# Patient Record
Sex: Female | Born: 1935 | Race: White | Hispanic: No | Smoking: Never smoker
Health system: Southern US, Community
[De-identification: ages and names within clinical notes are randomized; demographics above are authoritative.]

## PROBLEM LIST (undated history)

## (undated) DIAGNOSIS — G459 Transient cerebral ischemic attack, unspecified: Secondary | ICD-10-CM

## (undated) DIAGNOSIS — Z86718 Personal history of other venous thrombosis and embolism: Secondary | ICD-10-CM

## (undated) DIAGNOSIS — M5410 Radiculopathy, site unspecified: Secondary | ICD-10-CM

## (undated) DIAGNOSIS — G3184 Mild cognitive impairment, so stated: Secondary | ICD-10-CM

## (undated) DIAGNOSIS — I10 Essential (primary) hypertension: Secondary | ICD-10-CM

## (undated) HISTORY — PX: TONSILLECTOMY: SUR1361

## (undated) HISTORY — PX: TUBAL LIGATION: SHX77

---

## 2006-08-13 HISTORY — PX: APPENDECTOMY: SHX54

## 2019-05-21 ENCOUNTER — Emergency Department (HOSPITAL_COMMUNITY): Payer: Medicare Other

## 2019-05-21 ENCOUNTER — Other Ambulatory Visit: Payer: Self-pay

## 2019-05-21 ENCOUNTER — Encounter (HOSPITAL_COMMUNITY): Payer: Self-pay

## 2019-05-21 ENCOUNTER — Inpatient Hospital Stay (HOSPITAL_COMMUNITY)
Admission: EM | Admit: 2019-05-21 | Discharge: 2019-05-23 | DRG: 066 | Disposition: A | Discharge status: Home / Self Care | Payer: Medicare Other | Attending: Internal Medicine | Admitting: Internal Medicine

## 2019-05-21 DIAGNOSIS — Z880 Allergy status to penicillin: Secondary | ICD-10-CM

## 2019-05-21 DIAGNOSIS — R471 Dysarthria and anarthria: Secondary | ICD-10-CM | POA: Diagnosis present

## 2019-05-21 DIAGNOSIS — I10 Essential (primary) hypertension: Secondary | ICD-10-CM | POA: Diagnosis present

## 2019-05-21 DIAGNOSIS — R270 Ataxia, unspecified: Secondary | ICD-10-CM | POA: Diagnosis present

## 2019-05-21 DIAGNOSIS — E785 Hyperlipidemia, unspecified: Secondary | ICD-10-CM | POA: Diagnosis present

## 2019-05-21 DIAGNOSIS — I639 Cerebral infarction, unspecified: Secondary | ICD-10-CM

## 2019-05-21 DIAGNOSIS — R413 Other amnesia: Secondary | ICD-10-CM | POA: Diagnosis present

## 2019-05-21 DIAGNOSIS — I672 Cerebral atherosclerosis: Secondary | ICD-10-CM | POA: Diagnosis present

## 2019-05-21 DIAGNOSIS — R4781 Slurred speech: Secondary | ICD-10-CM | POA: Diagnosis not present

## 2019-05-21 DIAGNOSIS — I6381 Other cerebral infarction due to occlusion or stenosis of small artery: Principal | ICD-10-CM | POA: Diagnosis present

## 2019-05-21 DIAGNOSIS — R001 Bradycardia, unspecified: Secondary | ICD-10-CM | POA: Diagnosis present

## 2019-05-21 DIAGNOSIS — R7309 Other abnormal glucose: Secondary | ICD-10-CM | POA: Diagnosis present

## 2019-05-21 DIAGNOSIS — R030 Elevated blood-pressure reading, without diagnosis of hypertension: Secondary | ICD-10-CM

## 2019-05-21 DIAGNOSIS — Z20828 Contact with and (suspected) exposure to other viral communicable diseases: Secondary | ICD-10-CM | POA: Diagnosis present

## 2019-05-21 DIAGNOSIS — Z7982 Long term (current) use of aspirin: Secondary | ICD-10-CM

## 2019-05-21 DIAGNOSIS — Z8673 Personal history of transient ischemic attack (TIA), and cerebral infarction without residual deficits: Secondary | ICD-10-CM

## 2019-05-21 DIAGNOSIS — I6350 Cerebral infarction due to unspecified occlusion or stenosis of unspecified cerebral artery: Secondary | ICD-10-CM

## 2019-05-21 DIAGNOSIS — Z881 Allergy status to other antibiotic agents status: Secondary | ICD-10-CM

## 2019-05-21 DIAGNOSIS — Z79899 Other long term (current) drug therapy: Secondary | ICD-10-CM

## 2019-05-21 DIAGNOSIS — Z86718 Personal history of other venous thrombosis and embolism: Secondary | ICD-10-CM

## 2019-05-21 DIAGNOSIS — Z882 Allergy status to sulfonamides status: Secondary | ICD-10-CM

## 2019-05-21 HISTORY — DX: Personal history of other venous thrombosis and embolism: Z86.718

## 2019-05-21 HISTORY — DX: Mild cognitive impairment of uncertain or unknown etiology: G31.84

## 2019-05-21 HISTORY — DX: Radiculopathy, site unspecified: M54.10

## 2019-05-21 HISTORY — DX: Transient cerebral ischemic attack, unspecified: G45.9

## 2019-05-21 HISTORY — DX: Essential (primary) hypertension: I10

## 2019-05-21 LAB — CBC
HCT: 45.6 % (ref 36.0–46.0)
Hemoglobin: 15.4 g/dL — ABNORMAL HIGH (ref 12.0–15.0)
MCH: 31.4 pg (ref 26.0–34.0)
MCHC: 33.8 g/dL (ref 30.0–36.0)
MCV: 92.9 fL (ref 80.0–100.0)
Platelets: 204 10*3/uL (ref 150–400)
RBC: 4.91 MIL/uL (ref 3.87–5.11)
RDW: 13.4 % (ref 11.5–15.5)
WBC: 5.8 10*3/uL (ref 4.0–10.5)
nRBC: 0 % (ref 0.0–0.2)

## 2019-05-21 LAB — APTT: aPTT: 33 seconds (ref 24–36)

## 2019-05-21 LAB — DIFFERENTIAL
Abs Immature Granulocytes: 0.02 10*3/uL (ref 0.00–0.07)
Basophils Absolute: 0 10*3/uL (ref 0.0–0.1)
Basophils Relative: 1 %
Eosinophils Absolute: 0.1 10*3/uL (ref 0.0–0.5)
Eosinophils Relative: 1 %
Immature Granulocytes: 0 %
Lymphocytes Relative: 26 %
Lymphs Abs: 1.5 10*3/uL (ref 0.7–4.0)
Monocytes Absolute: 0.4 10*3/uL (ref 0.1–1.0)
Monocytes Relative: 7 %
Neutro Abs: 3.7 10*3/uL (ref 1.7–7.7)
Neutrophils Relative %: 65 %

## 2019-05-21 LAB — I-STAT CHEM 8, ED
BUN: 15 mg/dL (ref 8–23)
Calcium, Ion: 1.13 mmol/L — ABNORMAL LOW (ref 1.15–1.40)
Chloride: 103 mmol/L (ref 98–111)
Creatinine, Ser: 0.8 mg/dL (ref 0.44–1.00)
Glucose, Bld: 152 mg/dL — ABNORMAL HIGH (ref 70–99)
HCT: 45 % (ref 36.0–46.0)
Hemoglobin: 15.3 g/dL — ABNORMAL HIGH (ref 12.0–15.0)
Potassium: 4 mmol/L (ref 3.5–5.1)
Sodium: 139 mmol/L (ref 135–145)
TCO2: 25 mmol/L (ref 22–32)

## 2019-05-21 LAB — COMPREHENSIVE METABOLIC PANEL
ALT: 16 U/L (ref 0–44)
AST: 23 U/L (ref 15–41)
Albumin: 3.6 g/dL (ref 3.5–5.0)
Alkaline Phosphatase: 54 U/L (ref 38–126)
Anion gap: 9 (ref 5–15)
BUN: 14 mg/dL (ref 8–23)
CO2: 25 mmol/L (ref 22–32)
Calcium: 9.1 mg/dL (ref 8.9–10.3)
Chloride: 102 mmol/L (ref 98–111)
Creatinine, Ser: 1.02 mg/dL — ABNORMAL HIGH (ref 0.44–1.00)
GFR calc Af Amer: 56 mL/min — ABNORMAL LOW (ref >60)
GFR calc non Af Amer: 48 mL/min — ABNORMAL LOW (ref >60)
Glucose, Bld: 162 mg/dL — ABNORMAL HIGH (ref 70–99)
Potassium: 4 mmol/L (ref 3.5–5.1)
Sodium: 136 mmol/L (ref 135–145)
Total Bilirubin: 0.8 mg/dL (ref 0.3–1.2)
Total Protein: 6.1 g/dL — ABNORMAL LOW (ref 6.5–8.1)

## 2019-05-21 LAB — PROTIME-INR
INR: 1 (ref 0.8–1.2)
Prothrombin Time: 13.1 seconds (ref 11.4–15.2)

## 2019-05-21 MED ORDER — SODIUM CHLORIDE 0.9% FLUSH: 3.0000 mL | Freq: Once | INTRAVENOUS | Status: DC

## 2019-05-21 NOTE — ED Triage Notes (Signed)
Pt arrive POV for eval of dizziness, ataxia, slurred speech (w/ heavy tongue) onset Tuesday AM @ 0900. Daughter reports that she went home and slept for over 24 hours. States that she has regained ability to walk over last 24 hours. Reports ongoing aphasia, and speech difficulties. Daughter reports fall 2 weeks prior, on ASA. Daughter reports normally alert and healthy, walks 2 miles/day. Daughter reports ongoing fatigue, aphasia and confusion

## 2019-05-21 NOTE — ED Provider Notes (Signed)
[facility] Provider Note   CSN: [id number] Arrival date & time: 05/21/19  1412    History   Chief Complaint Chief Complaint  Patient presents with  . Dizziness  . Gait Problem    HPI Vicki Cummings is a 85 y.o. female.   The history is provided by a relative and the patient.  She has history of transient ischemic attack and comes in because of dizziness and speech problem.  She started having difficulty 2 days ago when she was very dizzy and completely unable to stand.  Speech was also markedly slurred.  She then slept for over 24 hours.  Since then, balance has improved but it is nowhere near her baseline.  Speech has improved but is still slightly slurred.  She denies headache, nausea, vomiting.  She did have an episode of dizziness about 30 years ago which was diagnosed as Mnire's disease.  Past Medical History:  Diagnosis Date  . H/O blood clots    "30-40 years ago"  . TIA (transient ischemic attack)     There are no active problems to display for this patient.   History reviewed. No pertinent surgical history.   OB History   No obstetric history on file.      Home Medications    Prior to Admission medications   Not on File    Family History History reviewed. No pertinent family history.  Social History Social History   Tobacco Use  . Smoking status: Never Smoker  . Smokeless tobacco: Never Used  Substance Use Topics  . Alcohol use: Never    Frequency: Never  . Drug use: Never     Allergies   Patient has no known allergies.   Review of Systems Review of Systems  Unable to perform ROS: Patient nonverbal  All other systems reviewed and are negative.    Physical Exam Updated Vital Signs BP (!) 169/71   Pulse (!) 54   Temp 98.5 F (36.9 C) (Oral)   Resp 16   Ht 5\' 1"  (1.549 m)   Wt 54.4 kg   SpO2 99%   BMI 22.67 kg/m   Physical Exam Vitals signs and nursing note reviewed.    85 year old  female, resting comfortably and in no acute distress. Vital signs are significant for elevated blood pressure and slightly slow heart rate. Oxygen saturation is 99%, which is normal. Head is normocephalic and atraumatic. PERRLA, EOMI. Oropharynx is clear. Neck is nontender and supple without adenopathy or JVD.  There are no carotid bruits. Back is nontender and there is no CVA tenderness. Lungs are clear without rales, wheezes, or rhonchi. Chest is nontender. Heart has regular rate and rhythm without murmur. Abdomen is soft, flat, nontender without masses or hepatosplenomegaly and peristalsis is normoactive. Extremities have no cyanosis or edema, full range of motion is present. Skin is warm and dry without rash. Neurologic: Awake and alert and oriented x3, speech is minimally dysarthric, cranial nerves are intact, there are no motor or sensory deficits.  Visual fields are intact to confrontation.  There is no pronator drift.  Finger-to-nose testing is normal.  Patient noted to have marked difficulty standing from transit chair to get into the stretcher.  ED Treatments / Results  Labs (all labs ordered are listed, but only abnormal results are displayed) Labs Reviewed  CBC - Abnormal; Notable for the following components:      Result Value   Hemoglobin 15.4 (*)    All  other components within normal limits  COMPREHENSIVE METABOLIC PANEL - Abnormal; Notable for the following components:   Glucose, Bld 162 (*)    Creatinine, Ser 1.02 (*)    Total Protein 6.1 (*)    GFR calc non Af Amer 48 (*)    GFR calc Af Amer 56 (*)    All other components within normal limits  I-STAT CHEM 8, ED - Abnormal; Notable for the following components:   Glucose, Bld 152 (*)    Calcium, Ion 1.13 (*)    Hemoglobin 15.3 (*)    All other components within normal limits  PROTIME-INR  APTT  DIFFERENTIAL  CBG MONITORING, ED    EKG EKG Interpretation  Date/Time:  Friday May 22 2019 01:52:25 EDT  Ventricular Rate:  47 PR Interval:    QRS Duration: 92 QT Interval:  470 QTC Calculation: 416 R Axis:   62 Text Interpretation:  Sinus bradycardia Minimal ST depression, inferior leads Minimal ST elevation, anterior leads No old tracing to compare Confirmed by Delora Fuel ([id number]) on 05/22/2019 2:02:27 AM   Radiology Ct Head Wo Contrast  Result Date: 05/21/2019 CLINICAL DATA:  Ataxia and slurred speech EXAM: CT HEAD WITHOUT CONTRAST TECHNIQUE: Contiguous axial images were obtained from the base of the skull through the vertex without intravenous contrast. COMPARISON:  None. FINDINGS: Brain: There is no mass, hemorrhage or extra-axial collection. There is generalized atrophy without lobar predilection. Hypodensity of the white matter is most commonly associated with chronic microvascular disease. Vascular: No abnormal hyperdensity of the major intracranial arteries or dural venous sinuses. No intracranial atherosclerosis. Skull: The visualized skull base, calvarium and extracranial soft tissues are normal. Sinuses/Orbits: Secretions in the sphenoid sinus. The orbits are normal. IMPRESSION: Chronic microvascular ischemia and generalized atrophy without acute intracranial abnormality. Electronically Signed   By: Ulyses Jarred M.D.   On: 05/21/2019 20:26   Mr Brain Wo Contrast  Result Date: 05/22/2019 CLINICAL DATA:  Slurred speech EXAM: MRI HEAD WITHOUT CONTRAST TECHNIQUE: Multiplanar, multiecho pulse sequences of the brain and surrounding structures were obtained without intravenous contrast. COMPARISON:  Head CT 05/21/2019 FINDINGS: BRAIN: There is an acute infarct of the left paramedian pons. No other acute ischemia. Multifocal white matter hyperintensity, most commonly due to chronic ischemic microangiopathy. There is generalized atrophy without lobar predilection. The midline structures are normal. VASCULAR: The major intracranial arterial and venous sinus flow voids are normal.  Susceptibility-sensitive sequences show no chronic microhemorrhage or superficial siderosis. SKULL AND UPPER CERVICAL SPINE: Calvarial bone marrow signal is normal. There is no skull base mass. The visualized upper cervical spine and soft tissues are normal. SINUSES/ORBITS: There are no fluid levels or advanced mucosal thickening. The mastoid air cells and middle ear cavities are free of fluid. The orbits are normal. IMPRESSION: 1. 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 2. No hemorrhage or mass effect. 3. Moderate chronic small vessel disease. Electronically Signed   By: Ulyses Jarred M.D.   On: 05/22/2019 01:04    Procedures Procedures   Medications Ordered in ED Medications  sodium chloride flush (NS) 0.9 % injection 3 mL (has no administration in time range)     Initial Impression / Assessment and Plan / ED Course  I have reviewed the triage vital signs and the nursing notes.  Pertinent labs & imaging results that were available during my care of the patient were reviewed by me and considered in my medical decision making (see chart for details).  Dizziness with ataxia and dysarthric  speech concerning for stroke.  Symptoms have improved dramatically but are still present.  Symptoms have been present for several days, so she is outside the window for thrombolytic therapy or endovascular therapy and outside of the window to activate code stroke.  ED work-up thus far has been unremarkable including normal CBC, metabolic panel significant for mildly elevated glucose.  CT head showed microvascular changes, no acute process.  She will be sent for MRI of the brain.  MRI does show left pontine stroke.  ECG shows minor nonspecific ST-T wave changes.  Case is discussed with Dr. Toniann Fail of [facility] hospitalist, who agrees to admit the patient.  Case also discussed with Dr. Otelia Limes of neurology service who will see the patient in consultation.  Final Clinical Impressions(s) / ED  Diagnoses   Final diagnoses:  Left pontine stroke (HCC)  Elevated blood pressure reading without diagnosis of hypertension    ED Discharge Orders    None       Dione Booze, MD 05/22/19 [phone]

## 2019-05-22 ENCOUNTER — Observation Stay (HOSPITAL_COMMUNITY): Payer: Medicare Other

## 2019-05-22 ENCOUNTER — Emergency Department (HOSPITAL_COMMUNITY): Payer: Medicare Other

## 2019-05-22 ENCOUNTER — Inpatient Hospital Stay (HOSPITAL_COMMUNITY): Payer: Medicare Other

## 2019-05-22 ENCOUNTER — Encounter (HOSPITAL_COMMUNITY): Payer: Self-pay | Admitting: Internal Medicine

## 2019-05-22 DIAGNOSIS — I672 Cerebral atherosclerosis: Secondary | ICD-10-CM | POA: Diagnosis present

## 2019-05-22 DIAGNOSIS — Z8673 Personal history of transient ischemic attack (TIA), and cerebral infarction without residual deficits: Secondary | ICD-10-CM | POA: Diagnosis not present

## 2019-05-22 DIAGNOSIS — I6389 Other cerebral infarction: Secondary | ICD-10-CM | POA: Diagnosis not present

## 2019-05-22 DIAGNOSIS — I6350 Cerebral infarction due to unspecified occlusion or stenosis of unspecified cerebral artery: Secondary | ICD-10-CM | POA: Diagnosis not present

## 2019-05-22 DIAGNOSIS — G3184 Mild cognitive impairment, so stated: Secondary | ICD-10-CM | POA: Insufficient documentation

## 2019-05-22 DIAGNOSIS — M5410 Radiculopathy, site unspecified: Secondary | ICD-10-CM | POA: Insufficient documentation

## 2019-05-22 DIAGNOSIS — I10 Essential (primary) hypertension: Secondary | ICD-10-CM | POA: Diagnosis present

## 2019-05-22 DIAGNOSIS — R413 Other amnesia: Secondary | ICD-10-CM | POA: Diagnosis present

## 2019-05-22 DIAGNOSIS — Z881 Allergy status to other antibiotic agents status: Secondary | ICD-10-CM | POA: Diagnosis not present

## 2019-05-22 DIAGNOSIS — R7309 Other abnormal glucose: Secondary | ICD-10-CM | POA: Diagnosis present

## 2019-05-22 DIAGNOSIS — R001 Bradycardia, unspecified: Secondary | ICD-10-CM | POA: Diagnosis present

## 2019-05-22 DIAGNOSIS — Z79899 Other long term (current) drug therapy: Secondary | ICD-10-CM | POA: Diagnosis not present

## 2019-05-22 DIAGNOSIS — I639 Cerebral infarction, unspecified: Secondary | ICD-10-CM

## 2019-05-22 DIAGNOSIS — Z880 Allergy status to penicillin: Secondary | ICD-10-CM | POA: Diagnosis not present

## 2019-05-22 DIAGNOSIS — R471 Dysarthria and anarthria: Secondary | ICD-10-CM | POA: Diagnosis present

## 2019-05-22 DIAGNOSIS — Z882 Allergy status to sulfonamides status: Secondary | ICD-10-CM | POA: Diagnosis not present

## 2019-05-22 DIAGNOSIS — Z86718 Personal history of other venous thrombosis and embolism: Secondary | ICD-10-CM | POA: Diagnosis not present

## 2019-05-22 DIAGNOSIS — I6381 Other cerebral infarction due to occlusion or stenosis of small artery: Secondary | ICD-10-CM | POA: Diagnosis present

## 2019-05-22 DIAGNOSIS — R4781 Slurred speech: Secondary | ICD-10-CM | POA: Diagnosis present

## 2019-05-22 DIAGNOSIS — R270 Ataxia, unspecified: Secondary | ICD-10-CM | POA: Diagnosis present

## 2019-05-22 DIAGNOSIS — Z20828 Contact with and (suspected) exposure to other viral communicable diseases: Secondary | ICD-10-CM | POA: Diagnosis present

## 2019-05-22 DIAGNOSIS — Z7982 Long term (current) use of aspirin: Secondary | ICD-10-CM | POA: Diagnosis not present

## 2019-05-22 DIAGNOSIS — E785 Hyperlipidemia, unspecified: Secondary | ICD-10-CM | POA: Diagnosis present

## 2019-05-22 LAB — HEMOGLOBIN A1C
Hgb A1c MFr Bld: 5.6 % (ref 4.8–5.6)
Mean Plasma Glucose: 114.02 mg/dL

## 2019-05-22 LAB — CBC
HCT: 45.1 % (ref 36.0–46.0)
Hemoglobin: 15.5 g/dL — ABNORMAL HIGH (ref 12.0–15.0)
MCH: 31.5 pg (ref 26.0–34.0)
MCHC: 34.4 g/dL (ref 30.0–36.0)
MCV: 91.7 fL (ref 80.0–100.0)
Platelets: 198 10*3/uL (ref 150–400)
RBC: 4.92 MIL/uL (ref 3.87–5.11)
RDW: 13.2 % (ref 11.5–15.5)
WBC: 5.6 10*3/uL (ref 4.0–10.5)
nRBC: 0 % (ref 0.0–0.2)

## 2019-05-22 LAB — COMPREHENSIVE METABOLIC PANEL
ALT: 16 U/L (ref 0–44)
AST: 23 U/L (ref 15–41)
Albumin: 3.6 g/dL (ref 3.5–5.0)
Alkaline Phosphatase: 50 U/L (ref 38–126)
Anion gap: 10 (ref 5–15)
BUN: 11 mg/dL (ref 8–23)
CO2: 27 mmol/L (ref 22–32)
Calcium: 9 mg/dL (ref 8.9–10.3)
Chloride: 103 mmol/L (ref 98–111)
Creatinine, Ser: 0.82 mg/dL (ref 0.44–1.00)
GFR calc Af Amer: >60 mL/min (ref >60)
GFR calc non Af Amer: >60 mL/min (ref >60)
Glucose, Bld: 106 mg/dL — ABNORMAL HIGH (ref 70–99)
Potassium: 3.9 mmol/L (ref 3.5–5.1)
Sodium: 140 mmol/L (ref 135–145)
Total Bilirubin: 1.1 mg/dL (ref 0.3–1.2)
Total Protein: 6 g/dL — ABNORMAL LOW (ref 6.5–8.1)

## 2019-05-22 LAB — LIPID PANEL
Cholesterol: 235 mg/dL — ABNORMAL HIGH (ref 0–200)
HDL: 78 mg/dL (ref >40)
LDL Cholesterol: 151 mg/dL — ABNORMAL HIGH (ref 0–99)
Total CHOL/HDL Ratio: 3 RATIO
Triglycerides: 32 mg/dL (ref <150)
VLDL: 6 mg/dL (ref 0–40)

## 2019-05-22 LAB — TROPONIN I (HIGH SENSITIVITY): Troponin I (High Sensitivity): 8 ng/L (ref <18)

## 2019-05-22 LAB — SARS CORONAVIRUS 2 (TAT 6-24 HRS): SARS Coronavirus 2: NEGATIVE

## 2019-05-22 LAB — ECHOCARDIOGRAM COMPLETE
Height: 61 in
Weight: 1920 oz

## 2019-05-22 LAB — TSH: TSH: 1.587 u[IU]/mL (ref 0.350–4.500)

## 2019-05-22 MED ORDER — ACETAMINOPHEN 160 MG/5ML PO SOLN: 650.0000 mg | ORAL | Status: DC | PRN

## 2019-05-22 MED ORDER — STROKE: EARLY STAGES OF RECOVERY BOOK: Freq: Once | Status: DC

## 2019-05-22 MED ORDER — HYDRALAZINE HCL 20 MG/ML IJ SOLN: 10.0000 mg | INTRAMUSCULAR | Status: DC | PRN

## 2019-05-22 MED ORDER — ENOXAPARIN SODIUM 40 MG/0.4ML ~~LOC~~ SOLN
40.0000 mg | SUBCUTANEOUS | Status: DC
  Administered 2019-05-22: 22:00:00 40 mg via SUBCUTANEOUS
  Filled 2019-05-22 (×3): qty 0.4

## 2019-05-22 MED ORDER — ATORVASTATIN CALCIUM 40 MG PO TABS
40.0000 mg | ORAL_TABLET | Freq: Every day | ORAL | Status: DC
  Administered 2019-05-22: 40 mg via ORAL
  Filled 2019-05-22: qty 1

## 2019-05-22 MED ORDER — SODIUM CHLORIDE 0.9 % IV SOLN
INTRAVENOUS | Status: DC
  Administered 2019-05-22: 09:00:00 via INTRAVENOUS

## 2019-05-22 MED ORDER — ATORVASTATIN CALCIUM 10 MG PO TABS: 20.0000 mg | ORAL_TABLET | Freq: Every day | ORAL | Status: DC

## 2019-05-22 MED ORDER — CLOPIDOGREL BISULFATE 75 MG PO TABS
75.0000 mg | ORAL_TABLET | Freq: Every day | ORAL | Status: DC
  Administered 2019-05-22 – 2019-05-23 (×2): 75 mg via ORAL
  Filled 2019-05-22 (×2): qty 1

## 2019-05-22 MED ORDER — ASPIRIN 325 MG PO TABS: 325.0000 mg | ORAL_TABLET | Freq: Every day | ORAL | Status: DC

## 2019-05-22 MED ORDER — ACETAMINOPHEN 650 MG RE SUPP: 650.0000 mg | RECTAL | Status: DC | PRN

## 2019-05-22 MED ORDER — ACETAMINOPHEN 325 MG PO TABS: 650.0000 mg | ORAL_TABLET | ORAL | Status: DC | PRN

## 2019-05-22 MED ORDER — ASPIRIN EC 81 MG PO TBEC
81.0000 mg | DELAYED_RELEASE_TABLET | Freq: Every day | ORAL | Status: DC
  Administered 2019-05-22 – 2019-05-23 (×2): 81 mg via ORAL
  Filled 2019-05-22 (×2): qty 1

## 2019-05-22 MED ORDER — ASPIRIN 300 MG RE SUPP: 300.0000 mg | Freq: Every day | RECTAL | Status: DC

## 2019-05-22 NOTE — ED Notes (Addendum)
Neuro assessment attempted, patient refusing to answer questions, when asking patient name, month, and age, patient responding "No." Patient able to follow commands, full movement in extremities. Previous RN reports patient ambulated to the bathroom independently

## 2019-05-22 NOTE — Consult Note (Signed)
Referring Physician: Dr. Roxanne Mins    Chief Complaint: Dizziness, ataxia and slurred speech  HPI: Vicki Cummings is an 85 y.o. female who presented to the ED for assessment of new onset dizziness and ataxia with slurred speech. Symptom onset was Tuesday at 0900. The patient had some improvement in her symptoms after sleeping for about 24 hours, regaining her ability to walk, but there have been ongoing speech difficulties as well as confusion and fatigue. The patient did have a fall about 2 weeks ago. At baseline, the patient is alert and walks 2 miles per day. She has a prior history of TIA and takes ASA at home.   MRI brain was obtained, revealing an acute versus early subacute left paramedian pontine infarct.   LSN: Tuesday at 0900 tPA Given: No: Out of time window.   Past Medical History:  Diagnosis Date  . H/O blood clots    "30-40 years ago"  . TIA (transient ischemic attack)     History reviewed. No pertinent surgical history.  History reviewed. No pertinent family history. Social History:  reports that she has never smoked. She has never used smokeless tobacco. She reports that she does not drink alcohol or use drugs.  Allergies: No Known Allergies  Home Medications: States that she takes ASA No list of medications on file in Epic   ROS: No headache, nausea or vomiting.  Other ROS as per HPI. Does not endorse additional symptoms on comprehensive review of systems.   Physical Examination: Blood pressure (!) 184/58, pulse (!) 49, temperature 98.5 F (36.9 C), temperature source Oral, resp. rate 15, height 5\' 1"  (1.549 m), weight 54.4 kg, SpO2 95 %.  HEENT: [place]/AT Lungs: Respirations unlabored Ext: No edema  Neurologic Examination: Mental Status: Alert, oriented, thought content appropriate.  Speech fluent without evidence of aphasia.  Able to follow all commands without difficulty. Cranial Nerves: II:  Visual fields intact with no extinction to DSS. PERRL.  III,IV, VI: No  ptosis. EOMI.   V,VII: Smile symmetric, facial temp sensation equal bilaterally VIII: hearing intact to conversation IX,X: no hypophonia XI: Symmetric shoulder shrug XII: midline tongue extension  Motor: Right : Upper extremity   5/5    Left:     Upper extremity   5/5  Lower extremity   5/5     Lower extremity   5/5 No pronator drift Sensory: Temp and light touch intact x 4. No extinction.  Deep Tendon Reflexes:  Normoactive x 4 Cerebellar: No definite ataxia with FNF bilaterally  Gait: Deferred  Results for orders placed or performed during the hospital encounter of 05/21/19 (from the past 48 hour(s))  Protime-INR     Status: None   Collection Time: 05/21/19  2:34 PM  Result Value Ref Range   Prothrombin Time 13.1 11.4 - 15.2 seconds   INR 1.0 0.8 - 1.2    Comment: (NOTE) INR goal varies based on device and disease states. Performed at [facility], [place] [address], [place], [place] [address]   APTT     Status: None   Collection Time: 05/21/19  2:34 PM  Result Value Ref Range   aPTT 33 24 - 36 seconds    Comment: Performed at [facility] [address], [place],  [address]  CBC     Status: Abnormal   Collection Time: 05/21/19  2:34 PM  Result Value Ref Range   WBC 5.8 4.0 - 10.5 K/uL   RBC 4.91 3.87 - 5.11 MIL/uL  Hemoglobin 15.4 (H) 12.0 - 15.0 g/dL   HCT 28.7 86.7 - 67.2 %   MCV 92.9 80.0 - 100.0 fL   MCH 31.4 26.0 - 34.0 pg   MCHC 33.8 30.0 - 36.0 g/dL   RDW 09.4 70.9 - 62.8 %   Platelets 204 150 - 400 K/uL   nRBC 0.0 0.0 - 0.2 %    Comment: Performed at [facility], [address] [place], [place] [address]  Differential     Status: None   Collection Time: 05/21/19  2:34 PM  Result Value Ref Range   Neutrophils Relative % 65 %   Neutro Abs 3.7 1.7 - 7.7 K/uL   Lymphocytes Relative 26 %   Lymphs Abs 1.5 0.7 - 4.0 K/uL   Monocytes Relative 7 %   Monocytes Absolute 0.4 0.1 - 1.0 K/uL   Eosinophils Relative 1 %   Eosinophils  Absolute 0.1 0.0 - 0.5 K/uL   Basophils Relative 1 %   Basophils Absolute 0.0 0.0 - 0.1 K/uL   Immature Granulocytes 0 %   Abs Immature Granulocytes 0.02 0.00 - 0.07 K/uL    Comment: Performed at [facility], [address] [place], [place] [address]  Comprehensive metabolic panel     Status: Abnormal   Collection Time: 05/21/19  2:34 PM  Result Value Ref Range   Sodium 136 135 - 145 mmol/L   Potassium 4.0 3.5 - 5.1 mmol/L   Chloride 102 98 - 111 mmol/L   CO2 25 22 - 32 mmol/L   Glucose, Bld 162 (H) 70 - 99 mg/dL   BUN 14 8 - 23 mg/dL   Creatinine, Ser 6.50 (H) 0.44 - 1.00 mg/dL   Calcium 9.1 8.9 - 35.4 mg/dL   Total Protein 6.1 (L) 6.5 - 8.1 g/dL   Albumin 3.6 3.5 - 5.0 g/dL   AST 23 15 - 41 U/L   ALT 16 0 - 44 U/L   Alkaline Phosphatase 54 38 - 126 U/L   Total Bilirubin 0.8 0.3 - 1.2 mg/dL   GFR calc non Af Amer 48 (L) >60 mL/min   GFR calc Af Amer 56 (L) >60 mL/min   Anion gap 9 5 - 15    Comment: Performed at [facility], [address] [place], [place] [address]  I-stat chem 8, ED     Status: Abnormal   Collection Time: 05/21/19  2:51 PM  Result Value Ref Range   Sodium 139 135 - 145 mmol/L   Potassium 4.0 3.5 - 5.1 mmol/L   Chloride 103 98 - 111 mmol/L   BUN 15 8 - 23 mg/dL   Creatinine, Ser 2.75 0.44 - 1.00 mg/dL   Glucose, Bld 170 (H) 70 - 99 mg/dL   Calcium, Ion 0.17 (L) 1.15 - 1.40 mmol/L   TCO2 25 22 - 32 mmol/L   Hemoglobin 15.3 (H) 12.0 - 15.0 g/dL   HCT 49.4 49.6 - 75.9 %   Ct Head Wo Contrast  Result Date: 05/21/2019 CLINICAL DATA:  Ataxia and slurred speech EXAM: CT HEAD WITHOUT CONTRAST TECHNIQUE: Contiguous axial images were obtained from the base of the skull through the vertex without intravenous contrast. COMPARISON:  None. FINDINGS: Brain: There is no mass, hemorrhage or extra-axial collection. There is generalized atrophy without lobar predilection. Hypodensity of the white matter is most commonly associated with chronic microvascular  disease. Vascular: No abnormal hyperdensity of the major intracranial arteries or dural venous sinuses. No intracranial atherosclerosis.  Skull: The visualized skull base, calvarium and extracranial soft tissues are normal. Sinuses/Orbits: Secretions in the sphenoid sinus. The orbits are normal. IMPRESSION: Chronic microvascular ischemia and generalized atrophy without acute intracranial abnormality. Electronically Signed   By: Deatra RobinsonKevin  Herman M.D.   On: 05/21/2019 20:26   Mr Brain Wo Contrast  Result Date: 05/22/2019 CLINICAL DATA:  Slurred speech EXAM: MRI HEAD WITHOUT CONTRAST TECHNIQUE: Multiplanar, multiecho pulse sequences of the brain and surrounding structures were obtained without intravenous contrast. COMPARISON:  Head CT 05/21/2019 FINDINGS: BRAIN: There is an acute infarct of the left paramedian pons. No other acute ischemia. Multifocal white matter hyperintensity, most commonly due to chronic ischemic microangiopathy. There is generalized atrophy without lobar predilection. The midline structures are normal. VASCULAR: The major intracranial arterial and venous sinus flow voids are normal. Susceptibility-sensitive sequences show no chronic microhemorrhage or superficial siderosis. SKULL AND UPPER CERVICAL SPINE: Calvarial bone marrow signal is normal. There is no skull base mass. The visualized upper cervical spine and soft tissues are normal. SINUSES/ORBITS: There are no fluid levels or advanced mucosal thickening. The mastoid air cells and middle ear cavities are free of fluid. The orbits are normal. IMPRESSION: 1. 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 2. No hemorrhage or mass effect. 3. Moderate chronic small vessel disease. Electronically Signed   By: Deatra RobinsonKevin  Herman M.D.   On: 05/22/2019 01:04    Assessment: 85 y.o. female with subacute left paramedian pontine ischemic infarction.  1. Exam reveals no definite lateralized abnormality 2. MRI brain reveals an acute or early  subacute left paramedian pontine infarct. Moderate chronic small vessel ischemic changes are also noted. 3. Stroke Risk Factors - Prior TIA and history of blood clots  Plan: 1. HgbA1c, fasting lipid panel 2. MRA of the brain without contrast 3. PT consult, OT consult, Speech consult 4. Echocardiogram 5. Carotid dopplers 6. Prophylactic therapy- Add Plavix to ASA 7. Risk factor modification 8. Telemetry monitoring 9. Frequent neuro checks 10. Benefits of statin most likely outweighed by risks given advanced age.  11. BP management. Out of permissive HTN time window.   @Electronically  signed: Dr. Caryl PinaEric Ilisha Blust@ 05/22/2019, 2:43 AM

## 2019-05-22 NOTE — Progress Notes (Signed)
  Echocardiogram 2D Echocardiogram has been performed.  Vicki Cummings 05/22/2019, 2:13 PM

## 2019-05-22 NOTE — Progress Notes (Signed)
STROKE TEAM PROGRESS NOTE   INTERVAL HISTORY Pt lying in bed, in good spirit, very pleasant. No complains.   Vitals:   05/22/19 1115 05/22/19 1130 05/22/19 1145 05/22/19 1200  BP: (!) 151/96 (!) 198/85 (!) 187/66 (!) 150/88  Pulse:      Resp:      Temp:      TempSrc:      SpO2:      Weight:      Height:        CBC:  Recent Labs  Lab 05/21/19 1434 05/21/19 1451 05/22/19 0740  WBC 5.8  --  5.6  NEUTROABS 3.7  --   --   HGB 15.4* 15.3* 15.5*  HCT 45.6 45.0 45.1  MCV 92.9  --  91.7  PLT 204  --  198    Basic Metabolic Panel:  Recent Labs  Lab 05/21/19 1434 05/21/19 1451 05/22/19 0740  NA 136 139 140  K 4.0 4.0 3.9  CL 102 103 103  CO2 25  --  27  GLUCOSE 162* 152* 106*  BUN 14 15 11   CREATININE 1.02* 0.80 0.82  CALCIUM 9.1  --  9.0   Lipid Panel:     Component Value Date/Time   CHOL 235 (H) 05/22/2019 0740   TRIG 32 05/22/2019 0740   HDL 78 05/22/2019 0740   CHOLHDL 3.0 05/22/2019 0740   VLDL 6 05/22/2019 0740   LDLCALC 151 (H) 05/22/2019 0740   HgbA1c:  Lab Results  Component Value Date   HGBA1C 5.6 05/22/2019   Urine Drug Screen: No results found for: LABOPIA, COCAINSCRNUR, LABBENZ, AMPHETMU, THCU, LABBARB  Alcohol Level No results found for: ETH  IMAGING Ct Head Wo Contrast  Result Date: 05/21/2019 CLINICAL DATA:  Ataxia and slurred speech EXAM: CT HEAD WITHOUT CONTRAST TECHNIQUE: Contiguous axial images were obtained from the base of the skull through the vertex without intravenous contrast. COMPARISON:  None. FINDINGS: Brain: There is no mass, hemorrhage or extra-axial collection. There is generalized atrophy without lobar predilection. Hypodensity of the white matter is most commonly associated with chronic microvascular disease. Vascular: No abnormal hyperdensity of the major intracranial arteries or dural venous sinuses. No intracranial atherosclerosis. Skull: The visualized skull base, calvarium and extracranial soft tissues are normal.  Sinuses/Orbits: Secretions in the sphenoid sinus. The orbits are normal. IMPRESSION: Chronic microvascular ischemia and generalized atrophy without acute intracranial abnormality. Electronically Signed   By: 07/21/2019 M.D.   On: 05/21/2019 20:26   Mr Angio Head Wo Contrast  Result Date: 05/22/2019 CLINICAL DATA:  Stroke follow-up EXAM: MRA HEAD WITHOUT CONTRAST TECHNIQUE: Angiographic images of the Circle of Willis were obtained using MRA technique without intravenous contrast. COMPARISON:  Brain MRI from earlier today FINDINGS: The vertebral and basilar arteries are diffusely patent. Mild atheromatous type narrowing and irregularity of the mid basilar. No branch occlusion, beading, or aneurysm. High-grade right P2 segment narrowing. Moderate left inferior division M2 narrowing. High-grade left superior PCA branch narrowing. IMPRESSION: 1. No emergent finding.  No significant basilar stenosis. 2. Intracranial atherosclerosis, most notably high-grade right P2 segment narrowing. Electronically Signed   By: 07/22/2019 M.D.   On: 05/22/2019 06:38   Mr Brain Wo Contrast  Result Date: 05/22/2019 CLINICAL DATA:  Slurred speech EXAM: MRI HEAD WITHOUT CONTRAST TECHNIQUE: Multiplanar, multiecho pulse sequences of the brain and surrounding structures were obtained without intravenous contrast. COMPARISON:  Head CT 05/21/2019 FINDINGS: BRAIN: There is an acute infarct of the left paramedian pons. No other acute  ischemia. Multifocal white matter hyperintensity, most commonly due to chronic ischemic microangiopathy. There is generalized atrophy without lobar predilection. The midline structures are normal. VASCULAR: The major intracranial arterial and venous sinus flow voids are normal. Susceptibility-sensitive sequences show no chronic microhemorrhage or superficial siderosis. SKULL AND UPPER CERVICAL SPINE: Calvarial bone marrow signal is normal. There is no skull base mass. The visualized upper cervical spine  and soft tissues are normal. SINUSES/ORBITS: There are no fluid levels or advanced mucosal thickening. The mastoid air cells and middle ear cavities are free of fluid. The orbits are normal. IMPRESSION: 1. 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 2. No hemorrhage or mass effect. 3. Moderate chronic small vessel disease. Electronically Signed   By: Deatra RobinsonKevin  Herman M.D.   On: 05/22/2019 01:04   [facility] [place] [facility]  Result Date: 05/22/2019 Carotid Arterial Duplex Study Indications:       CVA. Other Factors:     Intracranial atherosclerosis. Comparison Study:  no prior Performing Technologist: Jeb LeveringJill Parker RDMS, RVT  Examination Guidelines: A complete evaluation includes B-mode imaging, spectral Doppler, color Doppler, and power Doppler as needed of all accessible portions of each vessel. Bilateral testing is considered an integral part of a complete examination. Limited examinations for reoccurring indications may be performed as noted.  Right Carotid Findings: +----------+--------+--------+--------+-------------------------+--------+           PSV cm/sEDV cm/sStenosisPlaque Description       Comments +----------+--------+--------+--------+-------------------------+--------+ CCA Prox  83      9                                                 +----------+--------+--------+--------+-------------------------+--------+ CCA Distal49      9                                                 +----------+--------+--------+--------+-------------------------+--------+ ICA Prox  78      21      1-39%   heterogenous and calcific         +----------+--------+--------+--------+-------------------------+--------+ ICA Distal81      15                                                +----------+--------+--------+--------+-------------------------+--------+ ECA       79      11                                                 +----------+--------+--------+--------+-------------------------+--------+ +----------+--------+-------+----------------+-------------------+           PSV cm/sEDV cmsDescribe        Arm Pressure (mmHG) +----------+--------+-------+----------------+-------------------+ [id number]            Multiphasic, WNL                    +----------+--------+-------+----------------+-------------------+ +---------+--------+--+--------+-+---------+ VertebralPSV cm/s58EDV cm/s8Antegrade +---------+--------+--+--------+-+---------+  Left Carotid Findings: +----------+--------+--------+--------+------------------+--------+           PSV cm/sEDV cm/sStenosisPlaque  DescriptionComments +----------+--------+--------+--------+------------------+--------+ CCA Prox  84      10                                         +----------+--------+--------+--------+------------------+--------+ CCA Distal62      12                                         +----------+--------+--------+--------+------------------+--------+ ICA Prox  66      15      1-39%   heterogenous               +----------+--------+--------+--------+------------------+--------+ ICA Distal99      20                                         +----------+--------+--------+--------+------------------+--------+ ECA       55      4                                          +----------+--------+--------+--------+------------------+--------+ +----------+--------+--------+----------------+-------------------+           PSV cm/sEDV cm/sDescribe        Arm Pressure (mmHG) +----------+--------+--------+----------------+-------------------+ [id number]             Multiphasic, WNL                    +----------+--------+--------+----------------+-------------------+ +---------+--------+--+--------+-+---------+ VertebralPSV cm/s42EDV cm/s9Antegrade +---------+--------+--+--------+-+---------+  Summary: Right  Carotid: Velocities in the right ICA are consistent with a 1-39% stenosis. Left Carotid: Velocities in the left ICA are consistent with a 1-39% stenosis.  *See table(s) above for measurements and observations.     Preliminary     PHYSICAL EXAM  Pulse Rate:  [49-73] 56 (10/09 1515) Resp:  [14-17] 15 (10/09 1515) BP: (127-198)/(57-109) 129/67 (10/09 1515) SpO2:  [93 %-100 %] 93 % (10/09 1515)  General - Well nourished, well developed, in no apparent distress.  Ophthalmologic - fundi not visualized due to noncooperation.  Cardiovascular - Regular rhythm and rate.  Mental Status -  Level of arousal and orientation to time, place, and person were intact. Language including expression, naming, repetition, comprehension was assessed and found intact. Fund of Knowledge was assessed and was intact.  Cranial Nerves II - XII - II - Visual field intact OU. III, IV, VI - Extraocular movements intact. V - Facial sensation intact bilaterally. VII - Facial movement intact bilaterally. VIII - Hearing & vestibular intact bilaterally. X - Palate elevates symmetrically. XI - Chin turning & shoulder shrug intact bilaterally. XII - Tongue protrusion intact.  Motor Strength - The patient's strength was normal in all extremities and pronator drift was absent.  Bulk was normal and fasciculations were absent.   Motor Tone - Muscle tone was assessed at the neck and appendages and was normal.  Reflexes - The patient's reflexes were symmetrical in all extremities and she had no pathological reflexes.  Sensory - Light touch, temperature/pinprick were assessed and were symmetrical.    Coordination - The patient had normal movements in the hands with no ataxia or dysmetria. Left HTS slight dysmetria and right HTS mild dysmetria.  Tremor was absent.  Gait and Station - deferred.   ASSESSMENT/PLAN Ms. Vicki Cummings is a 85 y.o. female with history of TIA and blot clots 30-40 yrs ago presenting with  dizziness, ataxia and slurred speech.   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 Small vessel disease. Atrophy.     MRI  L paramedian pontine infarct. Moderate small vessel disease.   MRA  No ELVO. No significant BA stenosis. Intracranial atherosclerosis, including right P2 segmental high grade stenosis.   Carotid Doppler  B ICA 1-39% stenosis, VAs antegrade   2D Echo EF 60-65%  LDL 151  HgbA1c 5.6  Lovenox 40 mg sq daily for VTE prophylaxis  aspirin 81 mg daily prior to admission, now on aspirin 81 mg daily and clopidogrel 75 mg daily. Continue DAPT x 3 weeks then PLAVIX alone.   Therapy recommendations:  pending   Disposition:  pending   Hypertension  Stable but on the high side . Permissive hypertension (OK if < 220/120) but gradually normalize in 5-7 days . Long-term BP goal normotensive  Hyperlipidemia  Home meds:  Fish oil  Now on lipitor 40  LDL 151, goal < 70  Continue statin at discharge  Other Stroke Risk Factors  Advanced age  Other Active Problems  Baseline cognitive deficits  Sinus bradycardia  Hospital day # 0  Neurology will sign off. Please call with questions. Pt will follow up with stroke clinic NP at [facility] in about 4 weeks. Thanks for the consult.  Marvel Plan, MD PhD Stroke Neurology 05/22/2019 4:29 PM   To contact Stroke Continuity provider, please refer to [email or web address]. After hours, contact General Neurology

## 2019-05-22 NOTE — ED Notes (Signed)
Patient transported to MRI 

## 2019-05-22 NOTE — ED Notes (Signed)
Pt transported to MRI 

## 2019-05-22 NOTE — H&P (Signed)
History and Physical    Vicki Cummings JQZ:[id number] DOB: [birth year] DOA: 05/21/2019  PCP: Patient, No Pcp Per  Patient coming from: Home.  History obtained from patient's daughter.  Chief Complaint: Dizziness difficulty speaking.  HPI: Vicki Cummings is a 85 y.o. female with history of hypertension was brought to the ER after patient had persistent dizziness with confusion and difficulty speaking.  As per patient's daughter patient and family was in the car on October 6 three days ago when around 9:30 AM patient started feeling dizzy.  Since that incident patient has been getting more confused and difficulty ambulating and started having some difficulty talking.  As per patient's daughter patient is usually very active and walks 2 miles a day.  Given the symptoms patient was brought to the ER last evening and admitted for further management.  ED Course: CT head was unremarkable EKG was showing sinus bradycardia with a heart rate 47 bpm with nonspecific ST changes.  MRI of the brain shows left pontine infarct.  On exam patient is able to move all extremities is oriented to name and place and time no facial asymmetry tongue is midline.  Labs show creatinine 1.02 blood glucose 162 hemoglobin 15.4 platelets 204.  Review of Systems: As per HPI, rest all negative.   Past Medical History:  Diagnosis Date  . H/O blood clots    "30-40 years ago"  . TIA (transient ischemic attack)     History reviewed. No pertinent surgical history.   reports that she has never smoked. She has never used smokeless tobacco. She reports that she does not drink alcohol or use drugs.  No Known Allergies  Family History -   Prior to Admission medications   Not on File    Physical Exam: Constitutional: Moderately built and nourished. Vitals:   05/21/19 1726 05/21/19 2109 05/22/19 0204 05/22/19 0254  BP: (!) 142/57 (!) 169/71 (!) 184/58 (!) 198/91  Pulse: 62 (!) 54 (!) 49 (!) 52  Resp: 14 16 15 17   Temp:       TempSrc:      SpO2: 98% 99% 95% 99%  Weight:      Height:       Eyes: Anicteric no pallor. ENMT: No discharge from the ears eyes nose or mouth. Neck: No mass felt.  No neck rigidity. Respiratory: No rhonchi or crepitations. Cardiovascular: S1-S2 heard. Abdomen: Soft nontender bowel sounds present. Musculoskeletal: No edema. Skin: No rash. Neurologic: Alert awake oriented to time place and person.  Moves all extremities 5 x 5.  No facial asymmetry tongue is midline.  Pupils are equal and reacting to light. Psychiatric: Appears normal.   Labs on Admission: I have personally reviewed following labs and imaging studies  CBC: Recent Labs  Lab 05/21/19 1434 05/21/19 1451  WBC 5.8  --   NEUTROABS 3.7  --   HGB 15.4* 15.3*  HCT 45.6 45.0  MCV 92.9  --   PLT 204  --    Basic Metabolic Panel: Recent Labs  Lab 05/21/19 1434 05/21/19 1451  NA 136 139  K 4.0 4.0  CL 102 103  CO2 25  --   GLUCOSE 162* 152*  BUN 14 15  CREATININE 1.02* 0.80  CALCIUM 9.1  --    GFR: Estimated Creatinine Clearance: 35.3 mL/min (by C-G formula based on SCr of 0.8 mg/dL). Liver Function Tests: Recent Labs  Lab 05/21/19 1434  AST 23  ALT 16  ALKPHOS 54  BILITOT 0.8  PROT 6.1*  ALBUMIN 3.6   No results for input(s): LIPASE, AMYLASE in the last 168 hours. No results for input(s): AMMONIA in the last 168 hours. Coagulation Profile: Recent Labs  Lab 05/21/19 1434  INR 1.0   Cardiac Enzymes: No results for input(s): CKTOTAL, CKMB, CKMBINDEX, TROPONINI in the last 168 hours. BNP (last 3 results) No results for input(s): PROBNP in the last 8760 hours. HbA1C: No results for input(s): HGBA1C in the last 72 hours. CBG: No results for input(s): GLUCAP in the last 168 hours. Lipid Profile: No results for input(s): CHOL, HDL, LDLCALC, TRIG, CHOLHDL, LDLDIRECT in the last 72 hours. Thyroid Function Tests: No results for input(s): TSH, T4TOTAL, FREET4, T3FREE, THYROIDAB in the last 72  hours. Anemia Panel: No results for input(s): VITAMINB12, FOLATE, FERRITIN, TIBC, IRON, RETICCTPCT in the last 72 hours. Urine analysis: No results found for: COLORURINE, APPEARANCEUR, LABSPEC, PHURINE, GLUCOSEU, HGBUR, BILIRUBINUR, KETONESUR, PROTEINUR, UROBILINOGEN, NITRITE, LEUKOCYTESUR Sepsis Labs: @LABRCNTIP (procalcitonin:4,lacticidven:4) )No results found for this or any previous visit (from the past 240 hour(s)).   Radiological Exams on Admission: Ct Head Wo Contrast  Result Date: 05/21/2019 CLINICAL DATA:  Ataxia and slurred speech EXAM: CT HEAD WITHOUT CONTRAST TECHNIQUE: Contiguous axial images were obtained from the base of the skull through the vertex without intravenous contrast. COMPARISON:  None. FINDINGS: Brain: There is no mass, hemorrhage or extra-axial collection. There is generalized atrophy without lobar predilection. Hypodensity of the white matter is most commonly associated with chronic microvascular disease. Vascular: No abnormal hyperdensity of the major intracranial arteries or dural venous sinuses. No intracranial atherosclerosis. Skull: The visualized skull base, calvarium and extracranial soft tissues are normal. Sinuses/Orbits: Secretions in the sphenoid sinus. The orbits are normal. IMPRESSION: Chronic microvascular ischemia and generalized atrophy without acute intracranial abnormality. Electronically Signed   By: Deatra RobinsonKevin  Herman M.D.   On: 05/21/2019 20:26   Mr Brain Wo Contrast  Result Date: 05/22/2019 CLINICAL DATA:  Slurred speech EXAM: MRI HEAD WITHOUT CONTRAST TECHNIQUE: Multiplanar, multiecho pulse sequences of the brain and surrounding structures were obtained without intravenous contrast. COMPARISON:  Head CT 05/21/2019 FINDINGS: BRAIN: There is an acute infarct of the left paramedian pons. No other acute ischemia. Multifocal white matter hyperintensity, most commonly due to chronic ischemic microangiopathy. There is generalized atrophy without lobar  predilection. The midline structures are normal. VASCULAR: The major intracranial arterial and venous sinus flow voids are normal. Susceptibility-sensitive sequences show no chronic microhemorrhage or superficial siderosis. SKULL AND UPPER CERVICAL SPINE: Calvarial bone marrow signal is normal. There is no skull base mass. The visualized upper cervical spine and soft tissues are normal. SINUSES/ORBITS: There are no fluid levels or advanced mucosal thickening. The mastoid air cells and middle ear cavities are free of fluid. The orbits are normal. IMPRESSION: 1. 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 2. No hemorrhage or mass effect. 3. Moderate chronic small vessel disease. Electronically Signed   By: Deatra RobinsonKevin  Herman M.D.   On: 05/22/2019 01:04    EKG: Independently reviewed.  Sinus bradycardia with nonspecific T changes.  Assessment/Plan Principal Problem:   Acute CVA (cerebrovascular accident) [facility](HCC) Active Problems:   Essential hypertension    1. Acute CVA -discussed with Dr. Otelia LimesLindzen on-call neurologist.  Patient has past follow-up will keep patient on neurochecks get MRI of the brain 2D echo carotid Doppler hemoglobin A1c lipid panel patient on aspirin.  Will allow for permissive hypertension. 2. History of hypertension on Cozaar 25 mg presently would allow for permissive hypertension with PRN IV hydralazine for  systolic blood pressure more than 759 and diastolic more than 163. 3. History of memory deficits presently well oriented. 4. Sinus bradycardia with nonspecific changes will check TSH and troponin.   DVT prophylaxis: Lovenox. Code Status: Full code confirmed with patient's daughter. Family Communication: Patient's daughter. Disposition Plan: Home. Consults called: Neurology. Admission status: Observation   Rise Patience MD [facility] Pager [phone].  If 7PM-7AM, please contact night-coverage [email or web address] Password [email or web address]  05/22/2019, 4:48 AM

## 2019-05-22 NOTE — ED Notes (Signed)
Tele   Breakfast ordered  

## 2019-05-22 NOTE — Progress Notes (Signed)
Carotid duplex       has been completed. Preliminary results can be found under CV proc through chart review. Earland Reish, BS, RDMS, RVT   

## 2019-05-22 NOTE — ED Notes (Signed)
ED TO INPATIENT HANDOFF REPORT  ED Nurse Name and Phone #: Alycia Rossetti [phone]  S Name/Age/Gender Vicki Cummings 85 y.o. female Room/Bed: 054C/054C  Code Status   Code Status: Full Code  Home/SNF/Other Home Patient oriented to: self, place, time and situation Is this baseline? Yes   Triage Complete: Triage complete  Chief Complaint dizzy  Triage Note Pt arrive POV for eval of dizziness, ataxia, slurred speech (w/ heavy tongue) onset Tuesday AM @ 0900. Daughter reports that she went home and slept for over 24 hours. States that she has regained ability to walk over last 24 hours. Reports ongoing aphasia, and speech difficulties. Daughter reports fall 2 weeks prior, on ASA. Daughter reports normally alert and healthy, walks 2 miles/day. Daughter reports ongoing fatigue, aphasia and confusion   Allergies Allergies  Allergen Reactions  . Cephalexin   . Sulfa Antibiotics Hives  . Other Itching and Rash    Mangos, poison ivy, non organic Bananas, kiwi  Pesticide  . Penicillins Rash    Level of Care/Admitting Diagnosis ED Disposition    ED Disposition Condition Comment   Admit  Hospital Area: [facility] [[id number]]  Level of Care: Telemetry Medical [104]  Covid Evaluation: Asymptomatic Screening Protocol (No Symptoms)  Diagnosis: Acute CVA (cerebrovascular accident) [facility]) [[id number]]  Admitting Physician: Florene Glen  Attending Physician: Joseph Art [[id number]]  Estimated length of stay: past midnight tomorrow  Certification:: I certify this patient will need inpatient services for at least 2 midnights  PT Class (Do Not Modify): Inpatient [101]  PT Acc Code (Do Not Modify): Private [1]       B Medical/Surgery History Past Medical History:  Diagnosis Date  . H/O blood clots    "30-40 years ago"  . Hypertension   . Mild cognitive impairment with memory loss   . Radiculopathy   . TIA (transient ischemic attack)    History reviewed. No pertinent  surgical history.   A IV Location/Drains/Wounds Patient Lines/Drains/Airways Status   Active Line/Drains/Airways    Name:   Placement date:   Placement time:   Site:   Days:   Peripheral IV 05/22/19 Right Antecubital   05/22/19    0307    Antecubital   less than 1          Intake/Output Last 24 hours No intake or output data in the 24 hours ending 05/22/19 1818  Labs/Imaging Results for orders placed or performed during the hospital encounter of 05/21/19 (from the past 48 hour(s))  Protime-INR     Status: None   Collection Time: 05/21/19  2:34 PM  Result Value Ref Range   Prothrombin Time 13.1 11.4 - 15.2 seconds   INR 1.0 0.8 - 1.2    Comment: (NOTE) INR goal varies based on device and disease states. Performed at [facility], [address], [place], [place] [address]   APTT     Status: None   Collection Time: 05/21/19  2:34 PM  Result Value Ref Range   aPTT 33 24 - 36 seconds    Comment: Performed at [facility], [address], [place], [place] [address]  CBC     Status: Abnormal   Collection Time: 05/21/19  2:34 PM  Result Value Ref Range   WBC 5.8 4.0 - 10.5 K/uL   RBC 4.91 3.87 - 5.11 MIL/uL   Hemoglobin 15.4 (H) 12.0 - 15.0 g/dL   HCT 21.3 08.6 - 57.8 %   MCV 92.9  80.0 - 100.0 fL   MCH 31.4 26.0 - 34.0 pg   MCHC 33.8 30.0 - 36.0 g/dL   RDW 13.4 11.5 - 15.5 %   Platelets 204 150 - 400 K/uL   nRBC 0.0 0.0 - 0.2 %    Comment: Performed at [facility], [place] [address], [place], [place] [address]  Differential     Status: None   Collection Time: 05/21/19  2:34 PM  Result Value Ref Range   Neutrophils Relative % 65 %   Neutro Abs 3.7 1.7 - 7.7 K/uL   Lymphocytes Relative 26 %   Lymphs Abs 1.5 0.7 - 4.0 K/uL   Monocytes Relative 7 %   Monocytes Absolute 0.4 0.1 - 1.0 K/uL   Eosinophils Relative 1 %   Eosinophils Absolute 0.1 0.0 - 0.5 K/uL   Basophils Relative 1 %   Basophils Absolute 0.0 0.0 - 0.1 K/uL   Immature Granulocytes 0 %    Abs Immature Granulocytes 0.02 0.00 - 0.07 K/uL    Comment: Performed at [facility] [address], [place], [place] [address]  Comprehensive metabolic panel     Status: Abnormal   Collection Time: 05/21/19  2:34 PM  Result Value Ref Range   Sodium 136 135 - 145 mmol/L   Potassium 4.0 3.5 - 5.1 mmol/L   Chloride 102 98 - 111 mmol/L   CO2 25 22 - 32 mmol/L   Glucose, Bld 162 (H) 70 - 99 mg/dL   BUN 14 8 - 23 mg/dL   Creatinine, Ser 1.02 (H) 0.44 - 1.00 mg/dL   Calcium 9.1 8.9 - 10.3 mg/dL   Total Protein 6.1 (L) 6.5 - 8.1 g/dL   Albumin 3.6 3.5 - 5.0 g/dL   AST 23 15 - 41 U/L   ALT 16 0 - 44 U/L   Alkaline Phosphatase 54 38 - 126 U/L   Total Bilirubin 0.8 0.3 - 1.2 mg/dL   GFR calc non Af Amer 48 (L) >60 mL/min   GFR calc Af Amer 56 (L) >60 mL/min   Anion gap 9 5 - 15    Comment: Performed at [facility] [address], [place], [place] [address]  I-stat chem 8, ED     Status: Abnormal   Collection Time: 05/21/19  2:51 PM  Result Value Ref Range   Sodium 139 135 - 145 mmol/L   Potassium 4.0 3.5 - 5.1 mmol/L   Chloride 103 98 - 111 mmol/L   BUN 15 8 - 23 mg/dL   Creatinine, Ser 0.80 0.44 - 1.00 mg/dL   Glucose, Bld 152 (H) 70 - 99 mg/dL   Calcium, Ion 1.13 (L) 1.15 - 1.40 mmol/L   TCO2 25 22 - 32 mmol/L   Hemoglobin 15.3 (H) 12.0 - 15.0 g/dL   HCT 45.0 36.0 - 46.0 %  SARS CORONAVIRUS 2 (TAT 6-24 HRS) Nasopharyngeal Nasopharyngeal Swab     Status: None   Collection Time: 05/22/19  2:53 AM   Specimen: Nasopharyngeal Swab  Result Value Ref Range   SARS Coronavirus 2 NEGATIVE NEGATIVE    Comment: (NOTE) SARS-CoV-2 target nucleic acids are NOT DETECTED. The SARS-CoV-2 RNA is generally detectable in upper and lower respiratory specimens during the acute phase of infection. Negative results do not preclude SARS-CoV-2 infection, do not rule out co-infections with other pathogens, and should not be used as the sole basis for treatment or other patient management  decisions. Negative results must be combined with  clinical observations, patient history, and epidemiological information. The expected result is Negative. Fact Sheet for Patients: HairSlick.no Fact Sheet for Healthcare Providers: quierodirigir.com This test is not yet approved or cleared by the [place] FDA and  has been authorized for detection and/or diagnosis of SARS-CoV-2 by FDA under an Emergency Use Authorization (EUA). This EUA will remain  in effect (meaning this test can be used) for the duration of the COVID-19 declaration under Section 56 4(b)(1) of the Act, 21 U.S.C. section 360bbb-3(b)(1), unless the authorization is terminated or revoked sooner. Performed at [facility], [address], [place], [place] [address]   Hemoglobin A1c     Status: None   Collection Time: 05/22/19  7:40 AM  Result Value Ref Range   Hgb A1c MFr Bld 5.6 4.8 - 5.6 %    Comment: (NOTE) Pre diabetes:          5.7%-6.4% Diabetes:              >6.4% Glycemic control for   <7.0% adults with diabetes    Mean Plasma Glucose 114.02 mg/dL    Comment: Performed at [facility], [address] [place], [place] [address]  CBC     Status: Abnormal   Collection Time: 05/22/19  7:40 AM  Result Value Ref Range   WBC 5.6 4.0 - 10.5 K/uL   RBC 4.92 3.87 - 5.11 MIL/uL   Hemoglobin 15.5 (H) 12.0 - 15.0 g/dL   HCT 95.1 88.4 - 16.6 %   MCV 91.7 80.0 - 100.0 fL   MCH 31.5 26.0 - 34.0 pg   MCHC 34.4 30.0 - 36.0 g/dL   RDW 06.3 01.6 - 01.0 %   Platelets 198 150 - 400 K/uL   nRBC 0.0 0.0 - 0.2 %    Comment: Performed at [facility], [address], [place], [place] [address]  Comprehensive metabolic panel     Status: Abnormal   Collection Time: 05/22/19  7:40 AM  Result Value Ref Range   Sodium 140 135 - 145 mmol/L   Potassium 3.9 3.5 - 5.1 mmol/L   Chloride 103 98 - 111 mmol/L   CO2 27 22 - 32 mmol/L   Glucose, Bld 106 (H)  70 - 99 mg/dL   BUN 11 8 - 23 mg/dL   Creatinine, Ser 5.73 0.44 - 1.00 mg/dL   Calcium 9.0 8.9 - 22.0 mg/dL   Total Protein 6.0 (L) 6.5 - 8.1 g/dL   Albumin 3.6 3.5 - 5.0 g/dL   AST 23 15 - 41 U/L   ALT 16 0 - 44 U/L   Alkaline Phosphatase 50 38 - 126 U/L   Total Bilirubin 1.1 0.3 - 1.2 mg/dL   GFR calc non Af Amer >60 >60 mL/min   GFR calc Af Amer >60 >60 mL/min   Anion gap 10 5 - 15    Comment: Performed at [facility], [address], [place], [place] [address]  TSH     Status: None   Collection Time: 05/22/19  7:40 AM  Result Value Ref Range   TSH 1.587 0.350 - 4.500 uIU/mL    Comment: Performed by a 3rd Generation assay with a functional sensitivity of <=0.01 uIU/mL. Performed at [facility], [address] [place], [place] [address]   Lipid panel     Status: Abnormal   Collection Time: 05/22/19  7:40 AM  Result Value Ref Range   Cholesterol 235 (H)  0 - 200 mg/dL   Triglycerides 32 <161 mg/dL   HDL 78 >09 mg/dL   Total CHOL/HDL Ratio 3.0 RATIO   VLDL 6 0 - 40 mg/dL   LDL Cholesterol 604 (H) 0 - 99 mg/dL    Comment:        Total Cholesterol/HDL:CHD Risk Coronary Heart Disease Risk Table                     Men   Women  1/2 Average Risk   3.4   3.3  Average Risk       5.0   4.4  2 X Average Risk   9.6   7.1  3 X Average Risk  23.4   11.0        Use the calculated Patient Ratio above and the CHD Risk Table to determine the patient's CHD Risk.        ATP III CLASSIFICATION (LDL):  <100     mg/dL   Optimal  540-981  mg/dL   Near or Above                    Optimal  130-159  mg/dL   Borderline  191-478  mg/dL   High  >295     mg/dL   Very High Performed at [facility], [address], [place], [place] [address]   Troponin I (High Sensitivity)     Status: None   Collection Time: 05/22/19  7:40 AM  Result Value Ref Range   Troponin I (High Sensitivity) 8 <18 ng/L    Comment: (NOTE) Elevated high sensitivity troponin I (hsTnI) values and  significant  changes across serial measurements may suggest ACS but many other  chronic and acute conditions are known to elevate hsTnI results.  Refer to the "Links" section for chest pain algorithms and additional  guidance. Performed at [facility], [address], [place], [place] [address]    Ct Head Wo Contrast  Result Date: 05/21/2019 CLINICAL DATA:  Ataxia and slurred speech EXAM: CT HEAD WITHOUT CONTRAST TECHNIQUE: Contiguous axial images were obtained from the base of the skull through the vertex without intravenous contrast. COMPARISON:  None. FINDINGS: Brain: There is no mass, hemorrhage or extra-axial collection. There is generalized atrophy without lobar predilection. Hypodensity of the white matter is most commonly associated with chronic microvascular disease. Vascular: No abnormal hyperdensity of the major intracranial arteries or dural venous sinuses. No intracranial atherosclerosis. Skull: The visualized skull base, calvarium and extracranial soft tissues are normal. Sinuses/Orbits: Secretions in the sphenoid sinus. The orbits are normal. IMPRESSION: Chronic microvascular ischemia and generalized atrophy without acute intracranial abnormality. Electronically Signed   By: Deatra Robinson M.D.   On: 05/21/2019 20:26   Mr Angio Head Wo Contrast  Result Date: 05/22/2019 CLINICAL DATA:  Stroke follow-up EXAM: MRA HEAD WITHOUT CONTRAST TECHNIQUE: Angiographic images of the Circle of Willis were obtained using MRA technique without intravenous contrast. COMPARISON:  Brain MRI from earlier today FINDINGS: The vertebral and basilar arteries are diffusely patent. Mild atheromatous type narrowing and irregularity of the mid basilar. No branch occlusion, beading, or aneurysm. High-grade right P2 segment narrowing. Moderate left inferior division M2 narrowing. High-grade left superior PCA branch narrowing. IMPRESSION: 1. No emergent finding.  No significant basilar stenosis. 2. Intracranial  atherosclerosis, most notably high-grade right P2 segment narrowing. Electronically Signed   By: Marnee Spring M.D.   On: 05/22/2019 06:38   Mr Brain  Wo Contrast  Result Date: 05/22/2019 CLINICAL DATA:  Slurred speech EXAM: MRI HEAD WITHOUT CONTRAST TECHNIQUE: Multiplanar, multiecho pulse sequences of the brain and surrounding structures were obtained without intravenous contrast. COMPARISON:  Head CT 05/21/2019 FINDINGS: BRAIN: There is an acute infarct of the left paramedian pons. No other acute ischemia. Multifocal white matter hyperintensity, most commonly due to chronic ischemic microangiopathy. There is generalized atrophy without lobar predilection. The midline structures are normal. VASCULAR: The major intracranial arterial and venous sinus flow voids are normal. Susceptibility-sensitive sequences show no chronic microhemorrhage or superficial siderosis. SKULL AND UPPER CERVICAL SPINE: Calvarial bone marrow signal is normal. There is no skull base mass. The visualized upper cervical spine and soft tissues are normal. SINUSES/ORBITS: There are no fluid levels or advanced mucosal thickening. The mastoid air cells and middle ear cavities are free of fluid. The orbits are normal. IMPRESSION: 1. 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 2. No hemorrhage or mass effect. 3. Moderate chronic small vessel disease. Electronically Signed   By: Deatra Robinson M.D.   On: 05/22/2019 01:04   Vas US Carotid (at [facility])  Result Date: 05/22/2019 Carotid Arterial Duplex Study Indications:       CVA. Other Factors:     Intracranial atherosclerosis. Comparison Study:  no prior Performing Technologist: Jeb Levering RDMS, RVT  Examination Guidelines: A complete evaluation includes B-mode imaging, spectral Doppler, color Doppler, and power Doppler as needed of all accessible portions of each vessel. Bilateral testing is considered an integral part of a complete examination. Limited examinations for  reoccurring indications may be performed as noted.  Right Carotid Findings: +----------+--------+--------+--------+-------------------------+--------+           PSV cm/sEDV cm/sStenosisPlaque Description       Comments +----------+--------+--------+--------+-------------------------+--------+ CCA Prox  83      9                                                 +----------+--------+--------+--------+-------------------------+--------+ CCA Distal49      9                                                 +----------+--------+--------+--------+-------------------------+--------+ ICA Prox  78      21      1-39%   heterogenous and calcific         +----------+--------+--------+--------+-------------------------+--------+ ICA Distal81      15                                                +----------+--------+--------+--------+-------------------------+--------+ ECA       79      11                                                +----------+--------+--------+--------+-------------------------+--------+ +----------+--------+-------+----------------+-------------------+           PSV cm/sEDV cmsDescribe        Arm Pressure (mmHG) +----------+--------+-------+----------------+-------------------+ [id number]  Multiphasic, WNL                    +----------+--------+-------+----------------+-------------------+ +---------+--------+--+--------+-+---------+ VertebralPSV cm/s58EDV cm/s8Antegrade +---------+--------+--+--------+-+---------+  Left Carotid Findings: +----------+--------+--------+--------+------------------+--------+           PSV cm/sEDV cm/sStenosisPlaque DescriptionComments +----------+--------+--------+--------+------------------+--------+ CCA Prox  84      10                                         +----------+--------+--------+--------+------------------+--------+ CCA Distal62      12                                          +----------+--------+--------+--------+------------------+--------+ ICA Prox  66      15      1-39%   heterogenous               +----------+--------+--------+--------+------------------+--------+ ICA Distal99      20                                         +----------+--------+--------+--------+------------------+--------+ ECA       55      4                                          +----------+--------+--------+--------+------------------+--------+ +----------+--------+--------+----------------+-------------------+           PSV cm/sEDV cm/sDescribe        Arm Pressure (mmHG) +----------+--------+--------+----------------+-------------------+ [id number]             Multiphasic, WNL                    +----------+--------+--------+----------------+-------------------+ +---------+--------+--+--------+-+---------+ VertebralPSV cm/s42EDV cm/s9Antegrade +---------+--------+--+--------+-+---------+  Summary: Right Carotid: Velocities in the right ICA are consistent with a 1-39% stenosis. Left Carotid: Velocities in the left ICA are consistent with a 1-39% stenosis.  *See table(s) above for measurements and observations.     Preliminary     Pending Labs Unresulted Labs (From admission, onward)    Start     Ordered   05/29/19 0500  Creatinine, serum  (enoxaparin (LOVENOX)    CrCl >/= 30 ml/min)  Weekly,   R    Comments: while on enoxaparin therapy    05/22/19 0448          Vitals/Pain Today's Vitals   05/22/19 1315 05/22/19 1445 05/22/19 1515 05/22/19 1528  BP: (!) 171/67 130/82 129/67   Pulse:  (!) 57 (!) 56   Resp:  14 15   Temp:      TempSrc:      SpO2:  93% 93%   Weight:      Height:      PainSc:    0-No pain    Isolation Precautions No active isolations  Medications Medications  sodium chloride flush (NS) 0.9 % injection 3 mL (has no administration in time range)   stroke: mapping our early stages of recovery book (has no administration  in time range)  acetaminophen (TYLENOL) tablet 650 mg (has no administration in time range)    Or  acetaminophen (TYLENOL) solution 650 mg (has no administration in time range)  Or  acetaminophen (TYLENOL) suppository 650 mg (has no administration in time range)  enoxaparin (LOVENOX) injection 40 mg (has no administration in time range)  hydrALAZINE (APRESOLINE) injection 10 mg (has no administration in time range)  aspirin EC tablet 81 mg (81 mg Oral Given 05/22/19 1014)  clopidogrel (PLAVIX) tablet 75 mg (75 mg Oral Given 05/22/19 1014)  atorvastatin (LIPITOR) tablet 40 mg (has no administration in time range)    Mobility walks with person assist High fall risk   Focused Assessments    R Recommendations: See Admitting Provider Note  Report given to:   Additional Notes:

## 2019-05-22 NOTE — Progress Notes (Signed)
Patient admitted to OBS after midnight.   84 yo hx tia, htn, admitted with persistent dizziness and difficulty speaking. MRI brain reveals acute infarct. Admitted for stroke workup.    A/P  1. Acute CVA -follow 2D echo carotid Doppler hemoglobin A1c lipid panel patient on aspirin.  Will allow for permissive hypertension. 2. History of hypertension on Cozaar 25 mg. BP elevated. PRN IV hydralazine for systolic blood pressure more than 580 and diastolic more than 998. 3. History of memory deficits presently well oriented. Spoke with daughter who says early signs. Lives in [place] and is visiting daugher down here. Has been here 2 weeks. At baseline she manages her own affairs, meds, drives to appointments. Her daughter in [facility] manages her finances. She lives alone.  4. Sinus bradycardia with nonspecific changes. TSH 1.5.     Dyanne Carrel, NP

## 2019-05-22 NOTE — ED Notes (Signed)
Pt returned from MRI °

## 2019-05-23 ENCOUNTER — Other Ambulatory Visit: Payer: Self-pay

## 2019-05-23 LAB — TROPONIN I (HIGH SENSITIVITY): Troponin I (High Sensitivity): 11 ng/L (ref <18)

## 2019-05-23 MED ORDER — ATORVASTATIN CALCIUM 40 MG PO TABS: 40.0000 mg | ORAL_TABLET | Freq: Every day | ORAL | 0 refills | Status: DC

## 2019-05-23 MED ORDER — CLOPIDOGREL BISULFATE 75 MG PO TABS: 75.0000 mg | ORAL_TABLET | Freq: Every day | ORAL | 0 refills | Status: DC

## 2019-05-23 MED ORDER — ASPIRIN EC 81 MG PO TBEC: 81.0000 mg | DELAYED_RELEASE_TABLET | Freq: Every day | ORAL | 0 refills | Status: DC

## 2019-05-23 NOTE — Discharge Summary (Addendum)
Physician Discharge Summary  Vicki Cummings ZOX:[id number] DOB: [birth year]  PCP: Patient, No Pcp Per  Admitted from: Home Discharged to: Home  Admit date: 05/21/2019 Discharge date: 05/23/2019  Recommendations for Outpatient Follow-up:   Follow-up Information    [facility]. Schedule an appointment as soon as possible for a visit in 4 week(s).   Specialty: Neurology Contact information: [address] [place] [address] [phone]       Family Physician. Schedule an appointment as soon as possible for a visit in 1 week(s).   Contact information: Dr. Lorinda Creed, [place]           Home Health: Neuro outpatient PT & OT. Equipment/Devices: Youth rolling walker with 5 inch wheels, 3 n 1  Discharge Condition: Improved and stable CODE STATUS: Full Diet recommendation: Heart healthy diet.  Discharge Diagnoses:  Principal Problem:   Acute CVA (cerebrovascular accident) [facility]) Active Problems:   Essential hypertension   Brief Summary: 85 year old female, resident of [place], [place], visiting her daughter in [place] for the last 2 weeks, independent and physically active, PMH of TIA, HTN, "blood clots 30-40 years ago", presented with complaints of dizziness, ataxia and slurred speech that started on 10/6 at approximately 9 AM.  She had some improvement in symptoms after resting and sleeping, regain her ability to walk but had been having ongoing speech difficulties, confusion and fatigue.  She sustained a fall 2 weeks PTA.  Admitted for acute stroke evaluation and management.  Neurology consulted.  At baseline patient walks 2 miles a day and did yoga prior to COVID.  She plans to eventually return to [place].  Assessment and plan:  Acute ischemic stroke  CT head 10/8: Chronic microvascular ischemia and generalized atrophy without acute intracranial abnormality.  MRI brain: Acute or early subacute left paramedian pontine infarct.  No  hemorrhage or mass-effect.  Moderate chronic small vessel disease.  MRA head: No emergent finding.  No significant basilar stenosis.  Intracranial atherosclerosis, most notably high-grade right P2 segment narrowing.  TTE: LVEF 60-65%.  No embolic source identified.  Detailed report as below.  Carotid Dopplers: Bilateral ICA 1-39% stenosis.  A1c 5.6, TSH 1.57, LDL 151. HS moments x2: Negative.  SARS coronavirus 2 testing: Negative.  Neurology consultation appreciated.  Suspect acute stroke i.e. left paramedian pontine infarct secondary to small vessel disease.  She was on aspirin 81 mg daily PTA, now as per neurology on aspirin 81 mg daily plus Plavix 75 mg daily for 3 weeks and then Plavix alone.  SLP do not see any acute needs or follow-ups.  Therapies have evaluated and recommend neuro outpatient PT and OT and due to subtle visual deficits, they also recommend no driving until further evaluation as outpatient and needs to be cleared by MD to drive.  Outpatient neurology follow-up in 4 weeks either in [place] if she continues to be here or in [place] if she returns home.  Improved.  Essential hypertension  Allow permissive hypertension (okay if <220/120) but gradually normalize in 5-7 days.  Antihypertensives currently on hold, resume at discharge.  Hyperlipidemia  LDL 151, goal <70.  Started on Lipitor 40 mg daily, continue.  Recommend repeating fasting lipids and LFTs in 6 weeks.  Asymptomatic sinus bradycardia  Appears mild on telemetry.  TSH normal.  Echo normal.  Memory deficits  Consultations:  Neurology  Procedures:  TTE 05/22/2019: IMPRESSIONS    1. Left ventricular ejection fraction, by visual estimation, is 60 to 65%. The left ventricle  has normal function. Normal left ventricular size. There is no left ventricular hypertrophy.  2. Left ventricular diastolic Doppler parameters are consistent with impaired relaxation pattern of LV diastolic filling.   3. Mild calcification noted in left ventricular outflow tract adjacent to anterior leaflet of mitral valve.  4. Global right ventricle has normal systolic function.The right ventricular size is normal. No increase in right ventricular wall thickness.  5. Left atrial size was normal.  6. Right atrial size was normal.  7. The mitral valve is normal in structure. No evidence of mitral valve regurgitation. No evidence of mitral stenosis.  8. The tricuspid valve is normal in structure. Tricuspid valve regurgitation was not visualized by color flow Doppler.  9. The aortic valve is normal in structure. Aortic valve regurgitation was not visualized by color flow Doppler. Structurally normal aortic valve, with no evidence of sclerosis or stenosis. 10. The pulmonic valve was normal in structure. Pulmonic valve regurgitation is not visualized by color flow Doppler. 11. The inferior vena cava is normal in size with greater than 50% respiratory variability, suggesting right atrial pressure of 3 mmHg. 12. No embolic source identified.   Discharge Instructions  Discharge Instructions    Ambulatory referral to Neurology   Complete by: As directed    Follow up with stroke clinic NP (Jessica Vanschaick or Darrol Angelarolyn Martin, if both not available, consider Manson AllanSethi, Penumali, or Ahern) [facility] in about 4 weeks. Thanks.   Call MD for:   Complete by: As directed    Recurrent strokelike symptoms.   Call MD for:  extreme fatigue   Complete by: As directed    Call MD for:  persistant dizziness or light-headedness   Complete by: As directed    Diet - low sodium heart healthy   Complete by: As directed    Increase activity slowly   Complete by: As directed        Medication List    TAKE these medications   aspirin EC 81 MG tablet Take 1 tablet (81 mg total) by mouth daily for 21 days. Stop after 3 weeks. However, continue Plavix. What changed: additional instructions   atorvastatin 40 MG tablet Commonly known  as: LIPITOR Take 1 tablet (40 mg total) by mouth daily at 6 PM.   clopidogrel 75 MG tablet Commonly known as: PLAVIX Take 1 tablet (75 mg total) by mouth daily. Start taking on: May 24, 2019   Fish Oil 1000 MG Caps Take 1,000 mg by mouth daily.   losartan 25 MG tablet Commonly known as: COZAAR Take 25 mg by mouth daily.   magnesium oxide 400 MG tablet Commonly known as: MAG-OX Take 400 mg by mouth at bedtime.   multivitamin with minerals Tabs tablet Take 1 tablet by mouth daily.   TURMERIC CURCUMIN PO Take 1 capsule by mouth daily.      Allergies  Allergen Reactions  . Cephalexin   . Sulfa Antibiotics Hives  . Other Itching and Rash    Mangos, poison ivy, non organic Bananas, kiwi  Pesticide  . Penicillins Rash      Procedures/Studies: Ct Head Wo Contrast  Result Date: 05/21/2019 CLINICAL DATA:  Ataxia and slurred speech EXAM: CT HEAD WITHOUT CONTRAST TECHNIQUE: Contiguous axial images were obtained from the base of the skull through the vertex without intravenous contrast. COMPARISON:  None. FINDINGS: Brain: There is no mass, hemorrhage or extra-axial collection. There is generalized atrophy without lobar predilection. Hypodensity of the white matter is most commonly associated with  chronic microvascular disease. Vascular: No abnormal hyperdensity of the major intracranial arteries or dural venous sinuses. No intracranial atherosclerosis. Skull: The visualized skull base, calvarium and extracranial soft tissues are normal. Sinuses/Orbits: Secretions in the sphenoid sinus. The orbits are normal. IMPRESSION: Chronic microvascular ischemia and generalized atrophy without acute intracranial abnormality. Electronically Signed   By: Ulyses Jarred M.D.   On: 05/21/2019 20:26   Mr Angio Head Wo Contrast  Result Date: 05/22/2019 CLINICAL DATA:  Stroke follow-up EXAM: MRA HEAD WITHOUT CONTRAST TECHNIQUE: Angiographic images of the Circle of Willis were obtained using MRA  technique without intravenous contrast. COMPARISON:  Brain MRI from earlier today FINDINGS: The vertebral and basilar arteries are diffusely patent. Mild atheromatous type narrowing and irregularity of the mid basilar. No branch occlusion, beading, or aneurysm. High-grade right P2 segment narrowing. Moderate left inferior division M2 narrowing. High-grade left superior PCA branch narrowing. IMPRESSION: 1. No emergent finding.  No significant basilar stenosis. 2. Intracranial atherosclerosis, most notably high-grade right P2 segment narrowing. Electronically Signed   By: Monte Fantasia M.D.   On: 05/22/2019 06:38   Mr Brain Wo Contrast  Result Date: 05/22/2019 CLINICAL DATA:  Slurred speech EXAM: MRI HEAD WITHOUT CONTRAST TECHNIQUE: Multiplanar, multiecho pulse sequences of the brain and surrounding structures were obtained without intravenous contrast. COMPARISON:  Head CT 05/21/2019 FINDINGS: BRAIN: There is an acute infarct of the left paramedian pons. No other acute ischemia. Multifocal white matter hyperintensity, most commonly due to chronic ischemic microangiopathy. There is generalized atrophy without lobar predilection. The midline structures are normal. VASCULAR: The major intracranial arterial and venous sinus flow voids are normal. Susceptibility-sensitive sequences show no chronic microhemorrhage or superficial siderosis. SKULL AND UPPER CERVICAL SPINE: Calvarial bone marrow signal is normal. There is no skull base mass. The visualized upper cervical spine and soft tissues are normal. SINUSES/ORBITS: There are no fluid levels or advanced mucosal thickening. The mastoid air cells and middle ear cavities are free of fluid. The orbits are normal. IMPRESSION: 1. 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 2. No hemorrhage or mass effect. 3. Moderate chronic small vessel disease. Electronically Signed   By: Ulyses Jarred M.D.   On: 05/22/2019 01:04   Vas US Carotid (at [facility]  Only)  Result Date: 05/23/2019 Carotid Arterial Duplex Study Indications:       CVA. Other Factors:     Intracranial atherosclerosis. Comparison Study:  no prior Performing Technologist: June Leap RDMS, RVT  Examination Guidelines: A complete evaluation includes B-mode imaging, spectral Doppler, color Doppler, and power Doppler as needed of all accessible portions of each vessel. Bilateral testing is considered an integral part of a complete examination. Limited examinations for reoccurring indications may be performed as noted.  Right Carotid Findings: +----------+--------+--------+--------+-------------------------+--------+           PSV cm/sEDV cm/sStenosisPlaque Description       Comments +----------+--------+--------+--------+-------------------------+--------+ CCA Prox  83      9                                                 +----------+--------+--------+--------+-------------------------+--------+ CCA Distal49      9                                                 +----------+--------+--------+--------+-------------------------+--------+  ICA Prox  78      21      1-39%   heterogenous and calcific         +----------+--------+--------+--------+-------------------------+--------+ ICA Distal81      15                                                +----------+--------+--------+--------+-------------------------+--------+ ECA       79      11                                                +----------+--------+--------+--------+-------------------------+--------+ +----------+--------+-------+----------------+-------------------+           PSV cm/sEDV cmsDescribe        Arm Pressure (mmHG) +----------+--------+-------+----------------+-------------------+ [id number]            Multiphasic, WNL                    +----------+--------+-------+----------------+-------------------+ +---------+--------+--+--------+-+---------+ VertebralPSV cm/s58EDV  cm/s8Antegrade +---------+--------+--+--------+-+---------+  Left Carotid Findings: +----------+--------+--------+--------+------------------+--------+           PSV cm/sEDV cm/sStenosisPlaque DescriptionComments +----------+--------+--------+--------+------------------+--------+ CCA Prox  84      10                                         +----------+--------+--------+--------+------------------+--------+ CCA Distal62      12                                         +----------+--------+--------+--------+------------------+--------+ ICA Prox  66      15      1-39%   heterogenous               +----------+--------+--------+--------+------------------+--------+ ICA Distal99      20                                         +----------+--------+--------+--------+------------------+--------+ ECA       55      4                                          +----------+--------+--------+--------+------------------+--------+ +----------+--------+--------+----------------+-------------------+           PSV cm/sEDV cm/sDescribe        Arm Pressure (mmHG) +----------+--------+--------+----------------+-------------------+ [id number]             Multiphasic, WNL                    +----------+--------+--------+----------------+-------------------+ +---------+--------+--+--------+-+---------+ VertebralPSV cm/s42EDV cm/s9Antegrade +---------+--------+--+--------+-+---------+  Summary: Right Carotid: Velocities in the right ICA are consistent with a 1-39% stenosis. Left Carotid: Velocities in the left ICA are consistent with a 1-39% stenosis. Vertebrals:  Bilateral vertebral arteries demonstrate antegrade flow. Subclavians: Normal flow hemodynamics were seen in bilateral subclavian              arteries. *See table(s) above for  measurements and observations.  Electronically signed by Delia Heady MD on 05/23/2019 at 10:53:48 AM.    Final       Subjective: Patient  interviewed and examined along with PT and RN in room.  "I want to go home".  Reports feeling much better.  Indicates some unsteadiness of gait but denies dizziness, lightheadedness, slurred speech or fatigue.  Able to tell me that she is from [place], [place], visiting her daughter Vicki Cummings.  Coherent speech.  Does not appear confused.  Answers appropriately and follows instructions well.  Discharge Exam:  Vitals:   05/22/19 2346 05/23/19 0036 05/23/19 0437 05/23/19 0857  BP: (!) 195/89 (!) 191/78 130/62 (!) 156/68  Pulse: 62  (!) 56 62  Resp: Temp: 98 F (36.7 C)  97.9 F (36.6 C) (!) 97.4 F (36.3 C)  TempSrc: Oral  Oral Oral  SpO2: 96%  96% 98%  Weight:      Height:        General: Pleasant elderly female, moderately built and nourished lying comfortably propped up in bed without distress.  Oral mucosa moist. Cardiovascular: S1 & S2 heard, RRR, S1/S2 +. No murmurs, rubs, gallops or clicks. No JVD or pedal edema.  Telemetry personally reviewed: SB in the 50s-SR.  Occasional mild ST in the 110s, likely with activity with PT. Respiratory: Clear to auscultation without wheezing, rhonchi or crackles. No increased work of breathing. Abdominal:  Non distended, non tender & soft. No organomegaly or masses appreciated. Normal bowel sounds heard. CNS: Alert and oriented. No focal deficits. Extremities: no edema, no cyanosis.  Symmetric 5 x 5 power.  No pronator drift.    The results of significant diagnostics from this hospitalization (including imaging, microbiology, ancillary and laboratory) are listed below for reference.     Microbiology: Recent Results (from the past 240 hour(s))  SARS CORONAVIRUS 2 (TAT 6-24 HRS) Nasopharyngeal Nasopharyngeal Swab     Status: None   Collection Time: 05/22/19  2:53 AM   Specimen: Nasopharyngeal Swab  Result Value Ref Range Status   SARS Coronavirus 2 NEGATIVE NEGATIVE Final    Comment: (NOTE) SARS-CoV-2 target nucleic acids are NOT  DETECTED. The SARS-CoV-2 RNA is generally detectable in upper and lower respiratory specimens during the acute phase of infection. Negative results do not preclude SARS-CoV-2 infection, do not rule out co-infections with other pathogens, and should not be used as the sole basis for treatment or other patient management decisions. Negative results must be combined with clinical observations, patient history, and epidemiological information. The expected result is Negative. Fact Sheet for Patients: HairSlick.no Fact Sheet for Healthcare Providers: quierodirigir.com This test is not yet approved or cleared by the [place] FDA and  has been authorized for detection and/or diagnosis of SARS-CoV-2 by FDA under an Emergency Use Authorization (EUA). This EUA will remain  in effect (meaning this test can be used) for the duration of the COVID-19 declaration under Section 56 4(b)(1) of the Act, 21 U.S.C. section 360bbb-3(b)(1), unless the authorization is terminated or revoked sooner. Performed at [facility], [address] [place], [place] [address]      Labs: CBC: Recent Labs  Lab 05/21/19 1434 05/21/19 1451 05/22/19 0740  WBC 5.8  --  5.6  NEUTROABS 3.7  --   --   HGB 15.4* 15.3* 15.5*  HCT 45.6 45.0 45.1  MCV 92.9  --  91.7  PLT 204  --  198   Basic Metabolic Panel: Recent  Labs  Lab 05/21/19 1434 05/21/19 1451 05/22/19 0740  NA 136 139 140  K 4.0 4.0 3.9  CL 102 103 103  CO2 25  --  27  GLUCOSE 162* 152* 106*  BUN CREATININE 1.02* 0.80 0.82  CALCIUM 9.1  --  9.0   Liver Function Tests: Recent Labs  Lab 05/21/19 1434 05/22/19 0740  AST 23 23  ALT 16 16  ALKPHOS 54 50  BILITOT 0.8 1.1  PROT 6.1* 6.0*  ALBUMIN 3.6 3.6   Hgb A1c Recent Labs    05/22/19 0740  HGBA1C 5.6   Lipid Profile Recent Labs    05/22/19 0740  CHOL 235*  HDL 78  LDLCALC 151*  TRIG 32  CHOLHDL 3.0    Thyroid function studies Recent Labs    05/22/19 0740  TSH 1.587   I discussed in detail with patient's daughter, updated care and answered questions.  Went over medication list and advise regarding the same and importance of outpatient follow-up.  She verbalized understanding.  Time coordinating discharge: 40 minutes  SIGNED:  Marcellus Scott, MD, FACP, [facility]. [facility]  To contact the attending provider between 7A-7P or the covering provider during after hours 7P-7A, please log into the web site [email or web address] and access using universal [place] password for that web site. If you do not have the password, please call the hospital operator.

## 2019-05-23 NOTE — Plan of Care (Signed)
  Problem: Education: Goal: Knowledge of General Education information will improve Description: Including pain rating scale, medication(s)/side effects and non-pharmacologic comfort measures Outcome: Adequate for Discharge   Problem: Clinical Measurements: Goal: Ability to maintain clinical measurements within normal limits will improve Outcome: Adequate for Discharge Goal: Will remain free from infection Outcome: Adequate for Discharge Goal: Diagnostic test results will improve Outcome: Adequate for Discharge Goal: Respiratory complications will improve Outcome: Adequate for Discharge Goal: Cardiovascular complication will be avoided Outcome: Adequate for Discharge   Problem: Health Behavior/Discharge Planning: Goal: Ability to manage health-related needs will improve Outcome: Adequate for Discharge   Problem: Activity: Goal: Risk for activity intolerance will decrease Outcome: Adequate for Discharge   Problem: Nutrition: Goal: Adequate nutrition will be maintained Outcome: Adequate for Discharge   Problem: Coping: Goal: Level of anxiety will decrease Outcome: Adequate for Discharge   Problem: Elimination: Goal: Will not experience complications related to bowel motility Outcome: Adequate for Discharge Goal: Will not experience complications related to urinary retention Outcome: Adequate for Discharge   Problem: Pain Managment: Goal: General experience of comfort will improve Outcome: Adequate for Discharge   Problem: Safety: Goal: Ability to remain free from injury will improve Outcome: Adequate for Discharge   Problem: Skin Integrity: Goal: Risk for impaired skin integrity will decrease Outcome: Adequate for Discharge   Problem: Education: Goal: Knowledge of disease or condition will improve Outcome: Adequate for Discharge Goal: Knowledge of secondary prevention will improve Outcome: Adequate for Discharge Goal: Knowledge of patient specific risk factors  addressed and post discharge goals established will improve Outcome: Adequate for Discharge   Problem: Coping: Goal: Will verbalize positive feelings about self Outcome: Adequate for Discharge Goal: Will identify appropriate support needs Outcome: Adequate for Discharge   Problem: Nutrition: Goal: Dietary intake will improve Outcome: Adequate for Discharge   Problem: Ischemic Stroke/TIA Tissue Perfusion: Goal: Complications of ischemic stroke/TIA will be minimized Outcome: Adequate for Discharge

## 2019-05-23 NOTE — Progress Notes (Signed)
Discharge instruction reviewed with patient/family. All questions answered at this time. Transportation provided per pt's daughter. RW sent home with patient.  Ave Filter, RN

## 2019-05-23 NOTE — Discharge Instructions (Signed)

## 2019-05-23 NOTE — Evaluation (Signed)
Occupational Therapy Evaluation Patient Details Name: Vicki Cummings MRN: [id number] DOB: 14-Oct-1925 Today's Date: 05/23/2019    History of Present Illness Ms. Vicki Cummings is a 85 y.o. female with history of TIA and blot clots 30-40 yrs ago presenting with dizziness, ataxia and slurred speech.  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 All education has been completed and the patient has no further questions. Pt requires min guard assist for ADLs due to impaired balance.  She also demonstrates mildly dysconjugate gaze and decreased convergence resulting in undershooting, and difficulty reading.  Recommend pt use shower seat when showering, and that she eventually undergoes a driving assessment - daughter agreeable.  See below for any follow-up Occupational Therapy or equipment needs. OT is signing off. Thank you for this referral.      Follow Up Recommendations  Outpatient OT;Supervision/Assistance - 24 hour    Equipment Recommendations  None recommended by OT    Recommendations for Other Services       Precautions / Restrictions Precautions Precautions: Fall Precaution Comments: pt has had one fall in last month - fell forward on her nose for no reason      Mobility Bed Mobility                  Transfers Overall transfer level: Needs assistance   Transfers: Sit to/from Stand;Stand Pivot Transfers Sit to Stand: Min guard Stand pivot transfers: Min guard       General transfer comment: mildly unsteady     Balance Overall balance assessment: Needs assistance;History of Falls Sitting-balance support: Feet supported Sitting balance-Leahy Scale: Normal     Standing balance support: No upper extremity supported Standing balance-Leahy Scale: Fair Standing balance comment: able to maintain static standing with min guard assist                             ADL either performed or assessed with clinical judgement   ADL Overall ADL's : Needs assistance/impaired Eating/Feeding: Independent   Grooming: Wash/dry hands;Wash/dry face;Oral care;Brushing hair;Min guard;Standing   Upper Body Bathing: Set up;Sitting   Lower Body Bathing: Min guard;Sit to/from stand   Upper Body Dressing : Set up;Sitting   Lower Body Dressing: Min guard;Sit to/from stand   Toilet Transfer: Min guard;Ambulation;Comfort height toilet;RW   Toileting- Architect and Hygiene: Min guard;Sit to/from stand       Functional mobility during ADLs: Min guard;Rolling walker General ADL Comments: discussed need for shower seat with pt and daughter - daughter will acquire a seat      Vision Baseline Vision/History: Wears glasses Wears Glasses: At all times Patient Visual Report: Other (comment) Vision Assessment?: Yes Eye Alignment: Impaired (comment)(midly disconjugate ) Ocular Range of Motion: Within Functional Limits Tracking/Visual Pursuits: Able to track stimulus in all quads without difficulty Convergence: Impaired (comment) Visual Fields: No apparent deficits Depth Perception: Undershoots Additional Comments: daughter reports pt undershooting when eating and reaching for items.  Pt denies diplopia, but does have mildly dsyconjugate gaze.  convergence ~14" from nose which is abnormal.  She is able to read full page of info, but frequently has to adjust the book to keep it in focus, makes ~25% errors  and reports her speed is significantly decreased      Perception Perception Perception Tested?: Yes   Praxis Praxis Praxis tested?: Within functional  limits    Pertinent Vitals/Pain Pain Assessment: No/denies pain     Hand Dominance Right   Extremity/Trunk Assessment Upper Extremity Assessment Upper Extremity Assessment: Overall WFL for tasks assessed   Lower Extremity Assessment Lower Extremity Assessment: Defer to PT  evaluation   Cervical / Trunk Assessment Cervical / Trunk Assessment: Normal   Communication Communication Communication: No difficulties   Cognition Arousal/Alertness: Awake/alert Behavior During Therapy: WFL for tasks assessed/performed;Impulsive Overall Cognitive Status: Within Functional Limits for tasks assessed                                     General Comments  discussed recommendation for OPOT with pt and daughter and they agree.  Also discussed eventual driving assessment, and daughter agrees with need     Exercises     Shoulder Instructions      Home Living Family/patient expects to be discharged to:: Private residence Living Arrangements: Children Available Help at Discharge: Family Type of Home: House Home Access: Stairs to enter Technical brewer of Steps: 4 Entrance Stairs-Rails: Right Home Layout: Two level;Able to live on main level with bedroom/bathroom     Bathroom Shower/Tub: Occupational psychologist: Standard     Home Equipment: None      Lives With: Alone    Prior Functioning/Environment Level of Independence: Independent        Comments: Pt was walking 2 miles a day and taking yoga class until stopped by COVID        OT Problem List: Decreased activity tolerance;Impaired balance (sitting and/or standing);Impaired vision/perception;Decreased knowledge of use of DME or AE      OT Treatment/Interventions:      OT Goals(Current goals can be found in the care plan section) Acute Rehab OT Goals Patient Stated Goal: to get back to normal  OT Goal Formulation: All assessment and education complete, DC therapy  OT Frequency:     Barriers to D/C:            Co-evaluation              AM-PAC OT "6 Clicks" Daily Activity     Outcome Measure Help from another person eating meals?: None Help from another person taking care of personal grooming?: A Little Help from another person toileting, which  includes using toliet, bedpan, or urinal?: A Little Help from another person bathing (including washing, rinsing, drying)?: A Little Help from another person to put on and taking off regular upper body clothing?: A Little Help from another person to put on and taking off regular lower body clothing?: A Little 6 Click Score: 19   End of Session Equipment Utilized During Treatment: Rolling walker;Gait belt Nurse Communication: Mobility status  Activity Tolerance: Patient tolerated treatment well Patient left: in chair;with call bell/phone within reach;with family/visitor present  OT Visit Diagnosis: Unsteadiness on feet (R26.81)                Time: 1420-1507 OT Time Calculation (min): 47 min Charges:  OT General Charges $OT Visit: 1 Visit OT Evaluation $OT Eval Moderate Complexity: 1 Mod OT Treatments $Self Care/Home Management : 8-22 mins $Therapeutic Activity: 8-22 mins  Lucille Passy, OTR/L Acute Rehabilitation Services Pager [phone] Office [phone]   Lucille Passy M 05/23/2019, 3:25 PM

## 2019-05-23 NOTE — TOC Transition Note (Signed)
Transition of Care [facility]) - CM/SW Discharge Note   Patient Details  Name: Vicki Cummings MRN: [id number] Date of Birth: July 29, 1926  Transition of [facility]) CM/SW Contact:  Carles Collet, RN Phone Number: 05/23/2019, 3:48 PM   Clinical Narrative:   Damaris Schooner w patient's daughter. She is interested in OP PT OT. Referrals placed through Epic. RW will be delivered to room prior to DC, they are not interested in 3/1.     Final next level of care: Home/Self Care Barriers to Discharge: No Barriers Identified   Patient Goals and CMS Choice Patient states their goals for this hospitalization and ongoing recovery are:: to return home   Choice offered to / list presented to : NA  Discharge Placement                       Discharge Plan and Services                DME Arranged: Walker rolling DME Agency: [employer] Date DME Agency Contacted: 05/23/19 Time DME Agency Contacted: [phone] Representative spoke with at DME Agency: [place] ([place]) Interventions     Readmission Risk Interventions No flowsheet data found.

## 2019-05-23 NOTE — Evaluation (Addendum)
Pt wanting to do OPPT - neuro - instead of HH at this time.  Daughter feels she can provide transportation to the clinic. 05/23/2019   Vicki Cummings, PT    Physical Therapy Evaluation Patient Details Name: Vicki Cummings MRN: [id number] DOB: 05/27/1926 Today's Date: 05/23/2019   History of Present Illness  Ms. Vicki Cummings is a 85 y.o. female with history of TIA and blot clots 30-40 yrs ago presenting with dizziness, ataxia and slurred speech.  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  Pt lives alone.  No devices.  Recent fall forward on her face.  Pt now wobbly - no specifc weakness or numbness.  BERG 37/56.  Pt moves too quickly - impulsive -  decreased safety awareness.  Pt safer with RW.  We discussed safety at home [facility]'t [facility] night.  Pt would benefit from [facility] PT initially and progressing to OPPT to work on her balance and high level gait training.  Pt agreeable with plan and hopes to go home today with family assist.    Follow Up Recommendations Home health PT;Supervision for mobility/OOB    Equipment Recommendations  Rolling walker with 5" wheels;3in1 (PT)(pt need YOUTH RW.)    Recommendations for Other Services       Precautions / Restrictions Precautions Precautions: Fall Precaution Comments: pt has had one fall in last month - fell forward on her nose for no reason Restrictions Weight Bearing Restrictions: No      Mobility  Bed Mobility Overal bed mobility: Independent                Transfers Overall transfer level: Needs assistance   Transfers: Sit to/from Stand;Stand Pivot Transfers Sit to Stand: Min guard Stand pivot transfers: Min guard       General transfer comment: pt needed cuing for hand placement when using RW. she feels unsteady with initial  standing  Ambulation/Gait Ambulation/Gait assistance: Min assist Gait Distance (Feet): 100 Feet Assistive device: Rolling walker (2 wheeled) Gait Pattern/deviations: Narrow base of support;Step-through pattern     General Gait Details: Pt walked 100 feet with no device and needed min assist - lost balance with head turns, and with marching.  unable to walk on toes safely.  Pt rested and then walked again with RW -she felt much safer - needed SBA only - practiced turning slowly and side stepping between small areas.  pt needs Min guard backing up - less safe.  Overall pt walks too fast - fastest 85 year old i have worked with - i gave her multiple cues to slow down  Information systems managertairs            Wheelchair Mobility    Modified Rankin (Stroke Patients Only)       Balance Overall balance assessment: Needs assistance;History of Falls   Sitting balance-Leahy Scale: Normal       Standing balance-Leahy Scale: Fair Standing balance comment: pt not confident in her standing - looses balance all directions - esp posterior (but recent fall anterior)                 Standardized Balance Assessment Standardized Balance Assessment : Berg Balance Test Berg Balance Test Sit to Stand: Able to stand  independently using hands Standing Unsupported: Able to stand  safely 2 minutes Sitting with Back Unsupported but Feet Supported on Floor or Stool: Able to sit safely and securely 2 minutes Stand to Sit: Controls descent by using hands Transfers: Able to transfer safely, definite need of hands Standing Unsupported with Eyes Closed: Able to stand 10 seconds with supervision Standing Ubsupported with Feet Together: Able to place feet together independently and stand for 1 minute with supervision From Standing, Reach Forward with Outstretched Arm: Can reach forward >12 cm safely (5") From Standing Position, Pick up Object from Floor: Able to pick up shoe, needs supervision From Standing Position,  Turn to Look Behind Over each Shoulder: Looks behind from both sides and weight shifts well Turn 360 Degrees: Needs close supervision or verbal cueing Standing Unsupported, Alternately Place Feet on Step/Stool: Able to complete >2 steps/needs minimal assist Standing Unsupported, One Foot in Front: Able to take small step independently and hold 30 seconds Standing on One Leg: Unable to try or needs assist to prevent fall Total Score: 37         Pertinent Vitals/Pain Pain Assessment: No/denies pain    Home Living Family/patient expects to be discharged to:: Private residence Living Arrangements: Children Available Help at Discharge: Family;Available 24 hours/day Type of Home: House Home Access: Stairs to enter     Home Layout: Two level;Able to live on main level with bedroom/bathroom Home Equipment: None      Prior Function Level of Independence: Independent         Comments: Pt was walking 2 miles a day and taking yoga class until stopped by COVID     Hand Dominance        Extremity/Trunk Assessment   Upper Extremity Assessment Upper Extremity Assessment: Defer to OT evaluation    Lower Extremity Assessment Lower Extremity Assessment: Generalized weakness(esp in hips - glut med and ankles - when up on toes)    Cervical / Trunk Assessment Cervical / Trunk Assessment: Normal  Communication   Communication: No difficulties  Cognition Arousal/Alertness: Awake/alert Behavior During Therapy: WFL for tasks assessed/performed;Impulsive Overall Cognitive Status: Within Functional Limits for tasks assessed                                        General Comments General comments (skin integrity, edema, etc.): educated pt on monitoring her BP and HR and what neurologist is wanting her numbers to be.  discussed chol.  reveiwed safety issues of increased blood thinner    Exercises Other Exercises Other Exercises: pt standing at sink - one finger hold -  hip abduction - poor control. she couldnt slow down enough to get trunk erect and controlled.  needs work on this for balance Other Exercises: standing heel raises - both ankle wobbling   Assessment/Plan    PT Assessment Patient needs continued PT services  PT Problem List Decreased strength;Decreased safety awareness;Decreased knowledge of precautions;Decreased activity tolerance;Decreased balance;Decreased knowledge of use of DME       PT Treatment Interventions DME instruction;Therapeutic activities;Gait training;Patient/family education;Balance training;Functional mobility training;Neuromuscular re-education    PT Goals (Current goals can be found in the Care Plan section)  Acute Rehab PT Goals Patient Stated Goal: to get back to [place] before too long PT Goal Formulation: With patient Time For Goal Achievement: 05/30/19 Potential to Achieve Goals: Good    Frequency Min 3X/week   Barriers to discharge  Co-evaluation               AM-PAC PT "6 Clicks" Mobility  Outcome Measure Help needed turning from your back to your side while in a flat bed without using bedrails?: None Help needed moving from lying on your back to sitting on the side of a flat bed without using bedrails?: None Help needed moving to and from a bed to a chair (including a wheelchair)?: A Little Help needed standing up from a chair using your arms (e.g., wheelchair or bedside chair)?: A Little Help needed to walk in hospital room?: A Little Help needed climbing 3-5 steps with a railing? : A Lot 6 Click Score: 19    End of Session Equipment Utilized During Treatment: Gait belt Activity Tolerance: Patient tolerated treatment well Patient left: in chair;with call bell/phone within reach Nurse Communication: Mobility status PT Visit Diagnosis: Unsteadiness on feet (R26.81);Other abnormalities of gait and mobility (R26.89);History of falling (Z91.81)    Time: 9381-8299 PT Time Calculation  (min) (ACUTE ONLY): 50 min   Charges:   PT Evaluation $PT Eval Low Complexity: 1 Low PT Treatments $Gait Training: 8-22 mins $Therapeutic Exercise: 8-22 mins        05/23/2019   Rande Lawman, PT   Loyal Buba 05/23/2019, 10:08 AM

## 2019-05-23 NOTE — Evaluation (Addendum)
Speech Language Pathology Evaluation Patient Details Name: Vicki Cummings MRN: [id number] DOB: [birth year] Today's Date: 05/23/2019 Time: 1200-1230 SLP Time Calculation (min) (ACUTE ONLY): 30 min  Problem List:  Patient Active Problem List   Diagnosis Date Noted  . Acute CVA (cerebrovascular accident) (HCC) 05/22/2019  . Essential hypertension 05/22/2019  . Mild cognitive impairment with memory loss   . Radiculopathy   . Hypertension    Past Medical History:  Past Medical History:  Diagnosis Date  . H/O blood clots    "30-40 years ago"  . Hypertension   . Mild cognitive impairment with memory loss   . Radiculopathy   . TIA (transient ischemic attack)    Past Surgical History: History reviewed. No pertinent surgical history. HPI:  Ms. Vicki Cummings is a 85 y.o. female with history of TIA and blot clots 30-40 yrs ago presenting with dizziness, ataxia and slurred speech.  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  Per pt and daughter report, pt was living alone independently in a retirement community in [place], and one of her daughters visited her at least once a day.  Pt reported that her daughter handles her finances, but that she was responsible for managing her medications, grocery shopping, cooking, cleaning, etc.  Pt presents with functional cognitive-linguistic abilities for all tasks evaluated including language, short-term memory, problem solving, executive functioning, attention, and reasoning.   Pt additionally presents with functional reading and writing abilities.  Pt appeared to have some deficits in long-term memory during conversational speech and per daughter report; however, short-term recall was WNL based on an adapted task from the [facility].  Spoke with pt and daughter regarding recommendations for a memory book to aid in long-term  recall.  She exhibited a mild lingual deviation to the left, possibly indicating some lingual weakness, and she reported that her tongue "sometimes feels like it gets in the way" when she speaks.  However, pt's speech was 100% intelligible to an unfamiliar listener and her DDK rate was functional.  No further skilled ST is warranted at this time and no follow up is recommended at time of discharge.  Pt may benefit from supervision with IADLs (medication management, etc.) when she is first discharged. Please re-consult if additional needs arise.     SLP Assessment  SLP Recommendation/Assessment: Patient does not need any further Speech Lanaguage Pathology Services SLP Visit Diagnosis: Cognitive communication deficit (R41.841);Dysarthria and anarthria (R47.1)    Follow Up Recommendations  None    Frequency and Duration           SLP Evaluation Cognition  Overall Cognitive Status: Within Functional Limits for tasks assessed Arousal/Alertness: Awake/alert Orientation Level: Oriented X4 Attention: Sustained;Divided Sustained Attention: Appears intact Divided Attention: Appears intact Memory: Appears intact Awareness: Appears intact Problem Solving: Appears intact Executive Function: Reasoning;Organizing;Initiating Reasoning: Appears intact Organizing: Appears intact Initiating: Appears intact Safety/Judgment: Appears intact       Comprehension  Auditory Comprehension Overall Auditory Comprehension: Appears within functional limits for tasks assessed Reading Comprehension Reading Status: Within funtional limits    Expression Expression Primary Mode of Expression: Verbal Verbal Expression Overall Verbal Expression: Appears within functional limits for tasks assessed Written Expression Dominant Hand: Right Written Expression: Within Functional Limits   Oral / Motor  Oral Motor/Sensory Function Overall Oral Motor/Sensory Function: Mild impairment Facial ROM: Within Functional  Limits Facial Symmetry: Within Functional  Limits Lingual ROM: Within Functional Limits Lingual Symmetry: Abnormal symmetry left Lingual Strength: Within Functional Limits Motor Speech Overall Motor Speech: Appears within functional limits for tasks assessed Articulation: Within functional limitis Intelligibility: Intelligible Motor Planning: Witnin functional limits   Bretta Bang, M.S., [place] Office: [phone]                   [place] 05/23/2019, 12:47 PM

## 2019-05-28 ENCOUNTER — Ambulatory Visit: Payer: Medicare Other | Attending: Internal Medicine | Admitting: Occupational Therapy

## 2019-05-28 ENCOUNTER — Other Ambulatory Visit: Payer: Self-pay

## 2019-05-28 DIAGNOSIS — M6281 Muscle weakness (generalized): Secondary | ICD-10-CM | POA: Diagnosis present

## 2019-05-28 DIAGNOSIS — R41842 Visuospatial deficit: Secondary | ICD-10-CM

## 2019-05-28 DIAGNOSIS — R278 Other lack of coordination: Secondary | ICD-10-CM

## 2019-05-28 DIAGNOSIS — I69318 Other symptoms and signs involving cognitive functions following cerebral infarction: Secondary | ICD-10-CM | POA: Diagnosis present

## 2019-05-28 DIAGNOSIS — R2681 Unsteadiness on feet: Secondary | ICD-10-CM

## 2019-05-28 DIAGNOSIS — Z86718 Personal history of other venous thrombosis and embolism: Secondary | ICD-10-CM | POA: Diagnosis not present

## 2019-05-28 DIAGNOSIS — R2689 Other abnormalities of gait and mobility: Secondary | ICD-10-CM | POA: Insufficient documentation

## 2019-05-28 DIAGNOSIS — H5340 Unspecified visual field defects: Secondary | ICD-10-CM | POA: Diagnosis not present

## 2019-05-28 NOTE — Therapy (Addendum)
[facility] [address] [place], [place], [address] Phone: [phone]   Fax:  [phone]  Occupational Therapy Evaluation  Patient Details  Name: Vicki ArchSuzanne Pesola MRN: [id number] Date of Birth: 06/01/1926 Referring Provider (OT): Dr. Vashti HeyLisa Reid (MD in [place])    Encounter Date: 05/28/2019  OT End of Session - 05/28/19 1827    Visit Number  1    Number of Visits  17    Date for OT Re-Evaluation  07/28/19    Authorization Type  MCR, Generic commercial as secondary    Authorization - Visit Number  1    Authorization - Number of Visits  10    OT Start Time  1530    OT Stop Time  1615    OT Time Calculation (min)  45 min    Activity Tolerance  Patient tolerated treatment well    Behavior During Therapy  [facility] for tasks assessed/performed;Impulsive       Past Medical History:  Diagnosis Date  . H/O blood clots    "30-40 years ago"  . Hypertension   . Mild cognitive impairment with memory loss   . Radiculopathy   . TIA (transient ischemic attack)     No past surgical history on file.  There were no vitals filed for this visit.  Subjective Assessment - 05/28/19 1538    Subjective   My problem with vision is clearing up    Patient is accompanied by:  Family member   daughter   Pertinent History  pontine stroke 05/19/19. PMH: TIA's, HTN, radiculopathy    Patient Stated Goals  pt wants to return to driving and living [place]'ly    Currently in Pain?  No/denies        [facility] - 05/28/19 0001      Assessment   Medical Diagnosis  Lt pontine stroke    Referring Provider (OT)  Dr. Vashti HeyLisa Reid (MD in [place])     Onset Date/Surgical Date  05/19/19    Hand Dominance  Right      Precautions   Precaution Comments  no driving, decreased awareness into deficits and slightly impulsive       Balance Screen   Has the patient fallen in the past 6 months  Yes    How many times?  1   Pt scheduled for P.T. eval next week      Home  Environment   Bathroom Shower/Tub  Walk-in Shower   shower seat   Additional Comments  Pt was living alone in [place] prior to stroke (has one daughter in [place]), [place] younger [place] in [place] when [place] happened and now living with this daughter. Pt's home 1 story w/ 2 steps to enter. Pt lives on 1st floor of daughter's home currently w/ 4 steps to enter.       Prior Function   Level of Independence  Independent    Vocation  Retired    Youth workerVocation Requirements  dancing      ADL   Eating/Feeding  Independent    Grooming  Independent    Chief Operating OfficerUpper Body Bathing  Supervision/safety    Lower Body Bathing  Supervision/safety    Upper Body Dressing  Independent    Lower Body Dressing  Independent    Toilet Transfer  Independent    Toileting -  Geneticist, molecularHygiene  Independent    Tub/Shower Transfer  Supervision/safety      IADL   Shopping  --   not yet attempted  Light Housekeeping  Does not participate in any housekeeping tasks    Meal Prep  Needs to have meals prepared and served   other daughter in [place] provided most dinners   Community Mobility  Relies on family or friends for transportation    Medication Management  Is not capable of dispensing or managing own medication   at this time   Financial Management  --   Daughter in [place] was doing as she is Electronics engineer Status  Needs assist    Mobility Status Comments  using RW in community, not consistently in house      Written Expression   Dominant Hand  Right    Handwriting  90% legible      Vision - History   Baseline Vision  Wears glasses all the time    Additional Comments  cataract corrective sx      Vision Assessment   Eye Alignment  Impaired (comment)    Vision Assessment  --   Lt eye slightly medially deviates   Ocular Range of Motion  Within Functional Limits    Tracking/Visual Pursuits  Able to track stimulus in all quads without difficulty    Diplopia Assessment  --   Pt denies     Cognition   Overall  Cognitive Status  Impaired/Different from baseline, however ? If pt had some deficits prior to stroke.    Awareness  Impaired    Awareness Impairment  Emergent impairment    Cognition Comments  spells world backwards w/ mod mistakes but did correctly after spelling forwards. Pt unable to subtract by 7's, subtracting by 3's w/ max difficulty and min errors. Pt with decreased working memory and difficulty w/ mental calculations. Pt could recall 3/3 words after 5 min delay      Observation/Other Assessments   Observations  Pt very independent and appears to have decreased awareness into cognitive deficits but did admit that mental calculations were impaired and that she could have done better prior to stroke      Sensation   Light Touch  Not tested      Coordination   9 Hole Peg Test  Right;Left    Right 9 Hole Peg Test  34.53 sec    Left 9 Hole Peg Test  27.03 sec      Perception   Perception  Impaired    Spatial Orientation  to be further assessed      Edema   Edema  NONE      ROM / Strength   AROM / PROM / Strength  AROM;Strength      AROM   Overall AROM Comments  BUE AROM WFL's      Strength   Overall Strength Comments  BUE MMT 4+/5      Hand Function   Right Hand Grip (lbs)  50 LBS    Left Hand Grip (lbs)  55 LBS                        OT Short Term Goals - 05/28/19 1833      OT SHORT TERM GOAL #1   Title  Pt to be able to verbalize proper safety/problem solving techniques to hypothetical scenarios    Time  4    Period  Weeks    Status  New      OT SHORT TERM GOAL #2   Title  Pt able to verbalize memory compensatory  strategies, adaptations and potential A/E and/or external aids for medication management    Time  4    Period  Weeks    Status  New      OT SHORT TERM GOAL #3   Title  Pt able to perform simple snack/sandwich/microwaveable items safely with supervision    Time  4    Period  Weeks    Status  New      OT SHORT TERM GOAL #4    Title  Pt able to perform complete laundry task w/ supervision    Time  4    Period  Weeks    Status  New      OT SHORT TERM GOAL #5   Title  Pt independent with coordination HEP for Rt hand    Time  4    Period  Weeks    Status  New      Additional Short Term Goals   Additional Short Term Goals  Yes      OT SHORT TERM GOAL #6   Title  Pt to perform tabletop scanning with 90% or greater accuracy    Time  4    Period  Weeks    Status  New        OT Long Term Goals - 05/28/19 1837      OT LONG TERM GOAL #1   Title  Pt to be mod I for simple stovetop cooking    Time  8    Period  Weeks    Status  New      OT LONG TERM GOAL #2   Title  Pt to follow familiar recipe or simple recipe with sufficient memory and sequencing to perform mod I level    Time  8    Period  Weeks    Status  New      OT LONG TERM GOAL #3   Title  Pt to perform medication management I'ly (w/ external aids prn) after initial set up in pill box    Time  8    Period  Weeks    Status  New      OT LONG TERM GOAL #4   Title  Pt to be able to plan breakfast and lunch for week and corresponding grocery list w/ extra time prn    Time  8    Period  Weeks    Status  New      OT LONG TERM GOAL #5   Title  Pt able to perform basic money management/exchange with 90% accuracy    Time  8    Period  Weeks    Status  New      Long Term Additional Goals   Additional Long Term Goals  Yes      OT LONG TERM GOAL #6   Title  Pt to perform environmental scanning with 85% accuracy or greater    Time  8    Period  Weeks    Status  New            Plan - 05/28/19 1828    Clinical Impression Statement  Pt is a 85 y.o. female who presents to outpatient neuro rehab s/p Lt pontine stroke on 05/19/19 while visiting a daughter in [place]. Pt was living alone in [place], [place] prior to stroke, but has another daughter there that was overseeing financial tasks and providing dinners. Pt presents today w/ impulsivity,  decreased awareness, cognitive deficits, potential visuospatial deficits,  and mildly impaired strength and coordination.    OT Occupational Profile and History  Detailed Assessment- Review of Records and additional review of physical, cognitive, psychosocial history related to current functional performance    Occupational performance deficits (Please refer to evaluation for details):  IADL's;Leisure;Social Participation    Body Structure / Function / Physical Skills  Balance;ADL;Mobility;Strength;Coordination;Decreased knowledge of precautions;UE functional use    Cognitive Skills  Safety Awareness;Memory;Perception;Problem Solve;Sequencing;Understand    Rehab Potential  Good    Clinical Decision Making  Several treatment options, min-mod task modification necessary    Comorbidities Affecting Occupational Performance:  May have comorbidities impacting occupational performance    Modification or Assistance to Complete Evaluation   Min-Moderate modification of tasks or assist with assess necessary to complete eval    OT Frequency  2x / week    OT Duration  8 weeks   plus evaluation   OT Treatment/Interventions  Self-care/ADL training;Therapeutic exercise;Functional Mobility Training;Neuromuscular education;Therapeutic activities;Coping strategies training;DME and/or AE instruction;Cognitive remediation/compensation;Visual/perceptual remediation/compensation;Passive range of motion;Patient/family education    Plan  MOCA, possible referral to speech depending on MOCA score    Recommended Other Services  ? speech therapy    Consulted and Agree with Plan of Care  Patient;Family member/caregiver    Family Member Consulted  daughter       Patient will benefit from skilled therapeutic intervention in order to improve the following deficits and impairments:   Body Structure / Function / Physical Skills: Balance, ADL, Mobility, Strength, Coordination, Decreased knowledge of precautions, UE functional  use Cognitive Skills: Safety Awareness, Memory, Perception, Problem Solve, Sequencing, Understand     Visit Diagnosis: Other symptoms and signs involving cognitive functions following cerebral infarction  Visuospatial deficit  Unsteadiness on feet  Other lack of coordination  Muscle weakness (generalized)    Problem List Patient Active Problem List   Diagnosis Date Noted  . Acute CVA (cerebrovascular accident) ([place]) 05/22/2019  . Essential hypertension 05/22/2019  . Mild cognitive impairment with memory loss   . Radiculopathy   . Hypertension     Physician: Dr. Edrick Oh  Certification Start Date: 16/10/96 Certification End Date: 08/28/19  Physician Documentation Your signature is required to indicate approval of the treatment plan as stated above.  Please sign and either send electronically or make a copy of this report for your files and return this physician signed original.  Please mark one 1.__approve of plan   2. ___approve of plan with the followingconditions. ____________________________________________________________________________________________________________________________________________   ______________________                                                       _____________________ Physician Signature                                                                     Date    Faxed to MD for signature   Carey Bullocks, OTR/L 05/28/2019, 6:46 PM  [place] [address] [place], [place], [address] Phone: [phone]   Fax:  [phone]  Name: Tena Linebaugh MRN: [id number] Date of  Birth: 1926-05-14

## 2019-06-01 ENCOUNTER — Ambulatory Visit: Payer: Medicare Other | Admitting: Rehabilitation

## 2019-06-01 ENCOUNTER — Encounter: Payer: Self-pay | Admitting: Rehabilitation

## 2019-06-01 ENCOUNTER — Other Ambulatory Visit: Payer: Self-pay

## 2019-06-01 DIAGNOSIS — R2689 Other abnormalities of gait and mobility: Secondary | ICD-10-CM

## 2019-06-01 DIAGNOSIS — I69318 Other symptoms and signs involving cognitive functions following cerebral infarction: Secondary | ICD-10-CM | POA: Diagnosis not present

## 2019-06-01 DIAGNOSIS — R2681 Unsteadiness on feet: Secondary | ICD-10-CM

## 2019-06-01 NOTE — Therapy (Signed)
[facility] [address] [place], [place], [address] Phone: [phone]   Fax:  [phone]  Physical Therapy Evaluation  Patient Details  Name: Vicki Cummings MRN: [id number] Date of Birth: 11-18-1925 Referring Provider (PT): Vashti Hey, MD   Encounter Date: 06/01/2019  PT End of Session - 06/01/19 1524    Visit Number  1    Number of Visits  9    Date for PT Re-Evaluation  07/31/19   only plan to see for 30 days   Authorization Type  Medicare and HOP supplement: 10th visit progress note    PT Start Time  1406    PT Stop Time  1455    PT Time Calculation (min)  49 min    Activity Tolerance  Patient tolerated treatment well    Behavior During Therapy  [facility] for tasks assessed/performed       Past Medical History:  Diagnosis Date  . H/O blood clots    "30-40 years ago"  . Hypertension   . Mild cognitive impairment with memory loss   . Radiculopathy   . TIA (transient ischemic attack)     History reviewed. No pertinent surgical history.  There were no vitals filed for this visit.   Subjective Assessment - 06/01/19 1415    Subjective  "I just want to get back to normal"    Patient is accompained by:  Family member   [place]   Limitations  Walking    How long can you walk comfortably?  has not walked for exercise yet.    Patient Stated Goals  Pt would like to get better enough to move back to [place].    Currently in Pain?  No/denies         [facility] PT Assessment - 06/01/19 1420      Assessment   Medical Diagnosis  L Pontine CVA    Referring Provider (PT)  Vashti Hey, MD    Onset Date/Surgical Date  05/19/19    Hand Dominance  Right    Prior Therapy  acute       Precautions   Precautions  Fall    Precaution Comments  no driving, decreased awareness into deficits and slightly impulsive       Balance Screen   Has the patient fallen in the past 6 months  Yes    How many times?  1    Has the patient had a  decrease in activity level because of a fear of falling?   No    Is the patient reluctant to leave their home because of a fear of falling?   No      Home Environment   Living Environment  Private residence    Living Arrangements  Children    Available Help at Discharge  Family;Available 24 hours/day    Type of Home  House    Home Access  Stairs to enter    Entrance Stairs-Number of Steps  4   2 STE in [place]   Entrance Stairs-Rails  Can reach both   rails by stairs in [place]   Home Layout  One level    Home Equipment  Walker - 2 wheels;Grab bars - tub/shower;Shower seat      Prior Function   Level of Independence  Independent    Vocation  Retired    Leisure  dancing, zip lining, indoor skydiving       Cognition   Overall Cognitive Status  Impaired/Different from baseline  Awareness  Impaired    Awareness Impairment  Emergent impairment      Sensation   Light Touch  Appears Intact    Proprioception  Appears Intact      Coordination   Gross Motor Movements are Fluid and Coordinated  Yes    Fine Motor Movements are Fluid and Coordinated  Yes    Heel Shin Test  WFL      ROM / Strength   AROM / PROM / Strength  Strength      Strength   Overall Strength  Deficits    Overall Strength Comments  overall 4+/5 except for L hamstrings 3+/5 and B hip weakness but mild (4/5)      Transfers   Transfers  Sit to Stand;Stand to Sit    Sit to Stand  7: Independent    Five time sit to stand comments   8.78 secs without UE support    Stand to Sit  7: Independent      Ambulation/Gait   Ambulation/Gait  Yes    Ambulation/Gait Assistance  5: Supervision;6: Modified independent (Device/Increase time)    Ambulation/Gait Assistance Details  mod I with RW, S to mod I without walker over indoor surfaces.  Assessed gait over outdoor surfaces without device and pt is able to ambulate at S level over varying surfaces.  Did note some visuospacial deficits when transitioning between grass/pavement and  curb step.  .     Ambulation Distance (Feet)  500 Feet   x 2 reps    Assistive device  Rolling walker;None    Gait Pattern  Step-through pattern;Decreased stride length    Ambulation Surface  Level;Unlevel;Indoor;Outdoor;Paved;Grass    Gait velocity  4.65 ft/sec with RW, 4.02 ft/sec without RW    Stairs  Yes    Stairs Assistance  6: Modified independent (Device/Increase time)    Stair Management Technique  Two rails    Number of Stairs  4    Height of Stairs  6    Ramp  5: Supervision    Ramp Details (indicate cue type and reason)  without device     Curb  5: Supervision    Curb Details (indicate cue type and reason)  without device       Functional Gait  Assessment   Gait assessed   Yes    Gait Level Surface  Walks 20 ft in less than 5.5 sec, no assistive devices, good speed, no evidence for imbalance, normal gait pattern, deviates no more than 6 in outside of the 12 in walkway width.   4.97   Change in Gait Speed  Able to smoothly change walking speed without loss of balance or gait deviation. Deviate no more than 6 in outside of the 12 in walkway width.    Gait with Horizontal Head Turns  Performs head turns smoothly with slight change in gait velocity (eg, minor disruption to smooth gait path), deviates 6-10 in outside 12 in walkway width, or uses an assistive device.    Gait with Vertical Head Turns  Performs task with slight change in gait velocity (eg, minor disruption to smooth gait path), deviates 6 - 10 in outside 12 in walkway width or uses assistive device    Gait and Pivot Turn  Pivot turns safely within 3 sec and stops quickly with no loss of balance.    Step Over Obstacle  Is able to step over one shoe box (4.5 in total height) but must slow down and adjust  steps to clear box safely. May require verbal cueing.    Gait with Narrow Base of Support  Ambulates less than 4 steps heel to toe or cannot perform without assistance.    Gait with Eyes Closed  Walks 20 ft, slow speed,  abnormal gait pattern, evidence for imbalance, deviates 10-15 in outside 12 in walkway width. Requires more than 9 sec to ambulate 20 ft.    Ambulating Backwards  Walks 20 ft, uses assistive device, slower speed, mild gait deviations, deviates 6-10 in outside 12 in walkway width.    Steps  Alternating feet, must use rail.    Total Score  19    FGA comment:  19-24 = medium risk fall                Objective measurements completed on examination: See above findings.              PT Education - 06/01/19 1524    Education Details  education on evaluation results, goals, POC    Person(s) Educated  Patient;Child(ren)    Methods  Explanation;Demonstration    Comprehension  Verbalized understanding;Returned demonstration          PT Long Term Goals - 06/01/19 1542      PT LONG TERM GOAL #1   Title  Pt will be independent with HEP in order to indicate decreased fall risk and improved functional mobility. (Target Date: 07/01/19)    Time  4    Period  Weeks    Status  New    Target Date  07/01/19      PT LONG TERM GOAL #2   Title  Pt will improve gait speed to 4.37 ft/sec without AD in order to indicate normal gait speed.      PT LONG TERM GOAL #3   Title  Pt will improve FGA to >/=23/30 in order to indicate decreased fall risk.      PT LONG TERM GOAL #4   Title  Pt will ambulate >1000' independently over varying indoor and outdoor surfaces in order to indicate safe return to community and leisure activity.             Plan - 06/01/19 1525    Clinical Impression Statement  Pt presents with new L pontine CVA on 05/19/2019 now with very mild weakness and balance deficits. Note history of hypertension.  Upon PT evaluation, note that gait speed is very fast with RW at 4.65 ft/sec and without RW is 4.02 ft/sec, but she reports this is below her normal.  Also note some very mild visual spacial deficits when transitioning between surfaces and FGA score of 19/30  indicative of moderate fall risk.    Personal Factors and Comorbidities  Age;Comorbidity 1    Comorbidities  HTN    Examination-Activity Limitations  Stairs;Locomotion Level;Carry    Examination-Participation Restrictions  Driving;Shop;Other   exercise   Stability/Clinical Decision Making  Stable/Uncomplicated    Clinical Decision Making  Low    Rehab Potential  Excellent    PT Frequency  2x / week    PT Duration  4 weeks    PT Treatment/Interventions  ADLs/Self Care Home Management;Gait training;Stair training;Functional mobility training;Therapeutic activities;Therapeutic exercise;Balance training;Neuromuscular re-education;Patient/family education    PT Next Visit Plan  HIGH level balance-this lady is amazing!!, transitions between varying surfaces, compliant sufaces, SLS    Consulted and Agree with Plan of Care  Patient;Family member/caregiver    Family Member Consulted  daughter Harriett Sine  Patient will benefit from skilled therapeutic intervention in order to improve the following deficits and impairments:  Decreased activity tolerance, Decreased balance, Decreased endurance, Decreased safety awareness, Decreased strength  Visit Diagnosis: Unsteadiness on feet  Other abnormalities of gait and mobility     Problem List Patient Active Problem List   Diagnosis Date Noted  . Acute CVA (cerebrovascular accident) ([place]) 05/22/2019  . Essential hypertension 05/22/2019  . Mild cognitive impairment with memory loss   . Radiculopathy   . Hypertension     Cameron Sprang, PT, MPT [facility] [address] [place], [place], [address] Phone: [phone]   Fax:  [phone] 06/01/19, 3:56 PM  Name: Vicki Cummings MRN: [id number] Date of Birth: 09-Feb-1926

## 2019-06-02 ENCOUNTER — Ambulatory Visit: Payer: Medicare Other | Admitting: Occupational Therapy

## 2019-06-02 DIAGNOSIS — R41842 Visuospatial deficit: Secondary | ICD-10-CM

## 2019-06-02 DIAGNOSIS — I69318 Other symptoms and signs involving cognitive functions following cerebral infarction: Secondary | ICD-10-CM | POA: Diagnosis not present

## 2019-06-02 DIAGNOSIS — R2689 Other abnormalities of gait and mobility: Secondary | ICD-10-CM

## 2019-06-02 DIAGNOSIS — R278 Other lack of coordination: Secondary | ICD-10-CM

## 2019-06-02 DIAGNOSIS — M6281 Muscle weakness (generalized): Secondary | ICD-10-CM

## 2019-06-02 NOTE — Patient Instructions (Signed)
  Coordination Activities  Perform the following activities for 20 minutes 1 times per day with right hand(s).   Rotate ball in fingertips (clockwise and counter-clockwise).  Toss ball between hands.  Toss ball in air and catch with the same hand.  Flip cards 1 at a time as fast as you can.  Deal cards with your thumb (Hold deck in hand and push card off top with thumb).  Pick up coins, buttons, marbles, dried beans/pasta of different sizes and place in container.  Pick up coins and place in container or coin bank.  Pick up coins and stack.  Pick up coins one at a time until you get 5-10 in your hand, then move coins from palm to fingertips to stack one at a time.  Practice writing and/or typing. 

## 2019-06-03 NOTE — Therapy (Signed)
[facility] [address] [place], [place], [address] Phone: [phone]   Fax:  [phone]  Occupational Therapy Treatment  Patient Details  Name: Vicki Cummings MRN: [id number] Date of Birth: 1926/07/26 Referring Provider (OT): Dr. Vashti Hey (MD in [place])    Encounter Date: 06/02/2019  OT End of Session - 06/02/19 1324    Visit Number  2    Number of Visits  17    Date for OT Re-Evaluation  07/28/19    Authorization Type  MCR, Generic commercial as secondary    Authorization - Visit Number  2    Authorization - Number of Visits  10    OT Start Time  1322    OT Stop Time  1400    OT Time Calculation (min)  38 min    Activity Tolerance  Patient tolerated treatment well    Behavior During Therapy  [facility] for tasks assessed/performed;Impulsive       Past Medical History:  Diagnosis Date  . H/O blood clots    "30-40 years ago"  . Hypertension   . Mild cognitive impairment with memory loss   . Radiculopathy   . TIA (transient ischemic attack)     No past surgical history on file.  There were no vitals filed for this visit.  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 PMH: TIA's, HTN, radiculopathy    Patient Stated Goals  pt wants to return to driving and living [place]    Currently in Pain?  No/denies         [facility] OT Assessment - 06/03/19 0001      Cognition   MOCA  22/30    Cognition Comments  recalled 0/5 words, unable to perform trailmaking B   short term memory deficits, alternating attention deficits        MOCA administered see above.              OT Education - 06/03/19 1452    Education Details  coordination HEP-see pt instructions    Person(s) Educated  Patient    Methods  Explanation;Demonstration;Verbal cues;Handout    Comprehension  Verbalized understanding;Returned  demonstration;Verbal cues required       OT Short Term Goals - 06/02/19 1342      OT SHORT TERM GOAL #1   Title  Pt to be able to verbalize proper safety/problem solving techniques to hypothetical scenarios    Time  4    Period  Weeks    Status  On-going      OT SHORT TERM GOAL #2   Title  Pt able to verbalize memory compensatory strategies, adaptations and potential A/E and/or external aids for medication management    Time  4    Period  Weeks    Status  On-going      OT SHORT TERM GOAL #3   Title  Pt able to perform simple snack/sandwich/microwaveable items safely with supervision    Time  4    Period  Weeks    Status  On-going      OT SHORT TERM GOAL #4   Title  Pt able to perform complete laundry task w/ supervision    Time  4    Period  Weeks    Status  On-going      OT SHORT TERM GOAL #5   Title  Pt independent  with coordination HEP for Rt hand    Time  4    Period  Weeks    Status  On-going      OT SHORT TERM GOAL #6   Title  Pt to perform tabletop scanning with 90% or greater accuracy    Time  4    Period  Weeks    Status  On-going        OT Long Term Goals - 05/28/19 1837      OT LONG TERM GOAL #1   Title  Pt to be mod I for simple stovetop cooking    Time  8    Period  Weeks    Status  New      OT LONG TERM GOAL #2   Title  Pt to follow familiar recipe or simple recipe with sufficient memory and sequencing to perform mod I level    Time  8    Period  Weeks    Status  New      OT LONG TERM GOAL #3   Title  Pt to perform medication management I'ly (w/ external aids prn) after initial set up in pill box    Time  8    Period  Weeks    Status  New      OT LONG TERM GOAL #4   Title  Pt to be able to plan breakfast and lunch for week and corresponding grocery list w/ extra time prn    Time  8    Period  Weeks    Status  New      OT LONG TERM GOAL #5   Title  Pt able to perform basic money management/exchange with 90% accuracy    Time  8     Period  Weeks    Status  New      Long Term Additional Goals   Additional Long Term Goals  Yes      OT LONG TERM GOAL #6   Title  Pt to perform environmental scanning with 85% accuracy or greater    Time  8    Period  Weeks    Status  New            Plan - 06/03/19 1447    Clinical Impression Statement  Pt is progressing towards goals. Pt demonstrates understanding of coordination HEP. Pt and dtr to discuss referral to ST and let therapist know whether she would like to be referred.    OT Occupational Profile and History  Detailed Assessment- Review of Records and additional review of physical, cognitive, psychosocial history related to current functional performance    Occupational performance deficits (Please refer to evaluation for details):  IADL's;Leisure;Social Participation    Body Structure / Function / Physical Skills  Balance;ADL;Mobility;Strength;Coordination;Decreased knowledge of precautions;UE functional use    Cognitive Skills  Safety Awareness;Memory;Perception;Problem Solve;Sequencing;Understand    Rehab Potential  Good    Clinical Decision Making  Several treatment options, min-mod task modification necessary    Comorbidities Affecting Occupational Performance:  May have comorbidities impacting occupational performance    Modification or Assistance to Complete Evaluation   Min-Moderate modification of tasks or assist with assess necessary to complete eval    OT Frequency  2x / week    OT Duration  8 weeks   plus evaluation   OT Treatment/Interventions  Self-care/ADL training;Therapeutic exercise;Functional Mobility Training;Neuromuscular education;Therapeutic activities;Coping strategies training;DME and/or AE instruction;Cognitive remediation/compensation;Visual/perceptual remediation/compensation;Passive range of motion;Patient/family education    Plan  work towards goals    Recommended Other Services  ? speech therapy    Consulted and Agree with Plan of Care   Patient;Family member/caregiver    Family Member Consulted  daughter       Patient will benefit from skilled therapeutic intervention in order to improve the following deficits and impairments:   Body Structure / Function / Physical Skills: Balance, ADL, Mobility, Strength, Coordination, Decreased knowledge of precautions, UE functional use Cognitive Skills: Safety Awareness, Memory, Perception, Problem Solve, Sequencing, Understand     Visit Diagnosis: Other abnormalities of gait and mobility  Other symptoms and signs involving cognitive functions following cerebral infarction  Visuospatial deficit  Other lack of coordination  Muscle weakness (generalized)    Problem List Patient Active Problem List   Diagnosis Date Noted  . Acute CVA (cerebrovascular accident) (HCC) 05/22/2019  . Essential hypertension 05/22/2019  . Mild cognitive impairment with memory loss   . Radiculopathy   . Hypertension     Zorianna Taliaferro 06/03/2019, 2:53 PM  [facility] [address] [place], [place], [address] Phone: [phone]   Fax:  [phone]  Name: Vicki ArchSuzanne Cummings MRN: [id number] Date of Birth: 05/22/1926

## 2019-06-04 ENCOUNTER — Ambulatory Visit: Payer: Medicare Other | Admitting: Occupational Therapy

## 2019-06-04 ENCOUNTER — Other Ambulatory Visit: Payer: Self-pay

## 2019-06-04 DIAGNOSIS — I69318 Other symptoms and signs involving cognitive functions following cerebral infarction: Secondary | ICD-10-CM | POA: Diagnosis not present

## 2019-06-04 DIAGNOSIS — R41842 Visuospatial deficit: Secondary | ICD-10-CM

## 2019-06-04 DIAGNOSIS — R278 Other lack of coordination: Secondary | ICD-10-CM

## 2019-06-04 NOTE — Patient Instructions (Signed)

## 2019-06-04 NOTE — Therapy (Signed)
[facility] [address] [place], [place], [address] Phone: [phone]   Fax:  [phone]  Occupational Therapy Treatment  Patient Details  Name: Vicki Cummings MRN: [id number] Date of Birth: 11/05/1925 Referring Provider (OT): Dr. Vashti HeyLisa Reid (MD in [place])    Encounter Date: 06/04/2019  OT End of Session - 06/04/19 1741    Visit Number  3    Number of Visits  17    Date for OT Re-Evaluation  07/28/19    Authorization Type  MCR, Generic commercial as secondary    Authorization - Visit Number  3    Authorization - Number of Visits  10    OT Start Time  1703    OT Stop Time  1745    OT Time Calculation (min)  42 min    Activity Tolerance  Patient tolerated treatment well    Behavior During Therapy  [facility] for tasks assessed/performed;Impulsive       Past Medical History:  Diagnosis Date  . H/O blood clots    "30-40 years ago"  . Hypertension   . Mild cognitive impairment with memory loss   . Radiculopathy   . TIA (transient ischemic attack)     No past surgical history on file.  There were no vitals filed for this visit.  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 PMH: TIA's, HTN, radiculopathy    Patient Stated Goals  pt wants to return to driving and living [place]    Currently in Pain?  No/denies      Issued and reviewed memory compensatory strategies.  Trail making Part A at 100% accuracy and no difficulty. Trail making part B first 1/2 at 100%, however made multiple errors last 1/2 of task Tabletop visual scanning matching digital times to analogue times w/ extra time required.  Copying peg design with Rt hand for coordination and visual/perceptual skills w/ 100% accuracy and min difficulty using Rt hand at times.                      OT Education - 06/04/19 1707    Education Details  memory  compensatory strategies    Person(s) Educated  Patient;Child(ren)    Methods  Explanation;Handout    Comprehension  Verbalized understanding       OT Short Term Goals - 06/02/19 1342      OT SHORT TERM GOAL #1   Title  Pt to be able to verbalize proper safety/problem solving techniques to hypothetical scenarios    Time  4    Period  Weeks    Status  On-going      OT SHORT TERM GOAL #2   Title  Pt able to verbalize memory compensatory strategies, adaptations and potential A/E and/or external aids for medication management    Time  4    Period  Weeks    Status  On-going      OT SHORT TERM GOAL #3   Title  Pt able to perform simple snack/sandwich/microwaveable items safely with supervision    Time  4    Period  Weeks    Status  On-going      OT SHORT TERM GOAL #4   Title  Pt able to perform complete laundry task w/ supervision    Time  4    Period  Weeks    Status  On-going      OT SHORT TERM GOAL #5   Title  Pt independent with coordination HEP for Rt hand    Time  4    Period  Weeks    Status  On-going      OT SHORT TERM GOAL #6   Title  Pt to perform tabletop scanning with 90% or greater accuracy    Time  4    Period  Weeks    Status  On-going        OT Long Term Goals - 05/28/19 1837      OT LONG TERM GOAL #1   Title  Pt to be mod I for simple stovetop cooking    Time  8    Period  Weeks    Status  New      OT LONG TERM GOAL #2   Title  Pt to follow familiar recipe or simple recipe with sufficient memory and sequencing to perform mod I level    Time  8    Period  Weeks    Status  New      OT LONG TERM GOAL #3   Title  Pt to perform medication management I'ly (w/ external aids prn) after initial set up in pill box    Time  8    Period  Weeks    Status  New      OT LONG TERM GOAL #4   Title  Pt to be able to plan breakfast and lunch for week and corresponding grocery list w/ extra time prn    Time  8    Period  Weeks    Status  New      OT LONG  TERM GOAL #5   Title  Pt able to perform basic money management/exchange with 90% accuracy    Time  8    Period  Weeks    Status  New      Long Term Additional Goals   Additional Long Term Goals  Yes      OT LONG TERM GOAL #6   Title  Pt to perform environmental scanning with 85% accuracy or greater    Time  8    Period  Weeks    Status  New            Plan - 06/04/19 1741    Clinical Impression Statement  Pt progressing with coordination Rt hand. Pt also steadily improving w/ memory compensations    Occupational performance deficits (Please refer to evaluation for details):  IADL's;Leisure;Social Participation    Body Structure / Function / Physical Skills  Balance;ADL;Mobility;Strength;Coordination;Decreased knowledge of precautions;UE functional use    Cognitive Skills  Safety Awareness;Memory;Perception;Problem Solve;Sequencing;Understand    Rehab Potential  Good    OT Frequency  2x / week    OT Duration  8 weeks    OT Treatment/Interventions  Self-care/ADL training;Therapeutic exercise;Functional Mobility Training;Neuromuscular education;Therapeutic activities;Coping strategies training;DME and/or AE instruction;Cognitive remediation/compensation;Visual/perceptual remediation/compensation;Passive range of motion;Patient/family education    Plan  continue progress towards goals    Recommended Other Services  ? speech therapy    Consulted and Agree with Plan of Care  Patient;Family member/caregiver    Family Member Consulted  daughter       Patient will benefit from skilled therapeutic intervention in order to improve the following deficits and impairments:   Body Structure / Function / Physical Skills: Balance, ADL, Mobility, Strength, Coordination, Decreased knowledge of precautions, UE functional use Cognitive Skills: Safety  Awareness, Memory, Perception, Problem Solve, Sequencing, Understand     Visit Diagnosis: Other symptoms and signs involving cognitive  functions following cerebral infarction  Visuospatial deficit  Other lack of coordination    Problem List Patient Active Problem List   Diagnosis Date Noted  . Acute CVA (cerebrovascular accident) ([place]) 05/22/2019  . Essential hypertension 05/22/2019  . Mild cognitive impairment with memory loss   . Radiculopathy   . Hypertension     Carey Bullocks, OTR/L 06/04/2019, 5:43 PM  [facility] [address] [place], [place], [address] Phone: [phone]   Fax:  [phone]  Name: Vicki Cummings MRN: [id number] Date of Birth: Aug 28, 1925

## 2019-06-08 ENCOUNTER — Other Ambulatory Visit: Payer: Self-pay

## 2019-06-08 ENCOUNTER — Ambulatory Visit: Payer: Medicare Other | Admitting: Occupational Therapy

## 2019-06-08 ENCOUNTER — Encounter: Payer: Self-pay | Admitting: Occupational Therapy

## 2019-06-08 DIAGNOSIS — R278 Other lack of coordination: Secondary | ICD-10-CM

## 2019-06-08 DIAGNOSIS — M6281 Muscle weakness (generalized): Secondary | ICD-10-CM

## 2019-06-08 DIAGNOSIS — I69318 Other symptoms and signs involving cognitive functions following cerebral infarction: Secondary | ICD-10-CM

## 2019-06-08 DIAGNOSIS — R2681 Unsteadiness on feet: Secondary | ICD-10-CM

## 2019-06-08 DIAGNOSIS — R41842 Visuospatial deficit: Secondary | ICD-10-CM

## 2019-06-08 NOTE — Addendum Note (Signed)
Addended by: Cameron Sprang A on: 06/08/2019 09:01 AM   Modules accepted: Orders

## 2019-06-08 NOTE — Therapy (Signed)
[facility] [address] [place], [place], [address] Phone: [phone]   Fax:  [phone]  Occupational Therapy Treatment  Patient Details  Name: Vicki ArchSuzanne Chisholm MRN: [id number] Date of Birth: 07/11/1926 Referring Provider (OT): Dr. Vashti HeyLisa Reid ([place] in [place])    Encounter Date: 06/08/2019  OT End of Session - 06/08/19 1851    Visit Number  4    Number of Visits  17    Date for OT Re-Evaluation  07/28/19    Authorization Type  MCR, Generic commercial as secondary    Authorization - Visit Number  4    Authorization - Number of Visits  10    OT Start Time  1800    OT Stop Time  1840    OT Time Calculation (min)  40 min    Activity Tolerance  Patient tolerated treatment well    Behavior During Therapy  [facility] for tasks assessed/performed       Past Medical History:  Diagnosis Date  . H/O blood clots    "30-40 years ago"  . Hypertension   . Mild cognitive impairment with memory loss   . Radiculopathy   . TIA (transient ischemic attack)     History reviewed. No pertinent surgical history.  There were no vitals filed for this visit.  Subjective Assessment - 06/08/19 1806    Subjective   i am handling my own medication    Currently in Pain?  No/denies    Pain Score  0-No pain                   OT Treatments/Exercises (OP) - 06/08/19 0001      ADLs   Functional Mobility  Patient able to unload groceries into high cabinets, low drawers, and refrigerator without difficulty with no loss of balance.  Patient able to complete simulated bed making exercise with high bed- as in her apartment.  Able to bend and reach across bed without any difficulty.      Driving  Spoke to patient and daughter about recommendation for a more formal driving assessment if she was planning to return to driving.  Patient and daughter open to the idea, and patient has family in [place] that would drive her as needed.  Patient is aware  and accepts that it may not be agood idea for her to return to driving.      ADL Comments  Had overt discussion with patient and daughter about plans to return to [place], and this is the family's and patient's goal.  Patient encouraged to start testing herself with her family's supervision with IADL tasks that she'd need to complete at home - e.g. laundry, liight housekeeping (although family encouraging hired help) and meal preparation      Cognitive Exercises   Other Cognitive Exercises 1  Pattient reports thinking skills were a little muddled initially, but she feels this is improving.  She has returned to reading for pleasure, and is managing her own medicines again.  Daughter indicates that memory is still problematic at times - and we discussed how a new routine can be disruptive initially to memory.               OT Education - 06/08/19 1850    Education Details  the importance of routine to aide with memory    Person(s) Educated  Patient;Child(ren)    Methods  Explanation    Comprehension  Verbalized understanding  OT Short Term Goals - 06/08/19 1853      OT SHORT TERM GOAL #1   Title  Pt to be able to verbalize proper safety/problem solving techniques to hypothetical scenarios    Period  Weeks    Status  Achieved      OT SHORT TERM GOAL #2   Title  Pt able to verbalize memory compensatory strategies, adaptations and potential A/E and/or external aids for medication management    Time  4    Period  Weeks    Status  On-going      OT SHORT TERM GOAL #3   Title  Pt able to perform simple snack/sandwich/microwaveable items safely with supervision    Time  4    Period  Weeks    Status  Achieved      OT SHORT TERM GOAL #4   Title  Pt able to perform complete laundry task w/ supervision    Time  4    Period  Weeks    Status  On-going      OT SHORT TERM GOAL #5   Title  Pt independent with coordination HEP for Rt hand    Time  4    Period  Weeks    Status   On-going        OT Long Term Goals - 05/28/19 1837      OT LONG TERM GOAL #1   Title  Pt to be mod I for simple stovetop cooking    Time  8    Period  Weeks    Status  New      OT LONG TERM GOAL #2   Title  Pt to follow familiar recipe or simple recipe with sufficient memory and sequencing to perform mod I level    Time  8    Period  Weeks    Status  New      OT LONG TERM GOAL #3   Title  Pt to perform medication management I'ly (w/ external aids prn) after initial set up in pill box    Time  8    Period  Weeks    Status  New      OT LONG TERM GOAL #4   Title  Pt to be able to plan breakfast and lunch for week and corresponding grocery list w/ extra time prn    Time  8    Period  Weeks    Status  New      OT LONG TERM GOAL #5   Title  Pt able to perform basic money management/exchange with 90% accuracy    Time  8    Period  Weeks    Status  New      Long Term Additional Goals   Additional Long Term Goals  Yes      OT LONG TERM GOAL #6   Title  Pt to perform environmental scanning with 85% accuracy or greater    Time  8    Period  Weeks    Status  New            Plan - 06/08/19 1852    Clinical Impression Statement  Patient showing excellent progress and is increasing activity level at her daughter's home.    OT Frequency  2x / week    OT Duration  8 weeks    OT Treatment/Interventions  Self-care/ADL training;Therapeutic exercise;Functional Mobility Training;Neuromuscular education;Therapeutic activities;Coping strategies training;DME and/or AE instruction;Cognitive remediation/compensation;Visual/perceptual remediation/compensation;Passive range of motion;Patient/family  education    Plan  continue progress towards goals.  Prepare a simple, familiar meal    Consulted and Agree with Plan of Care  Patient;Family member/caregiver    Family Member Consulted  daughter       Patient will benefit from skilled therapeutic intervention in order to improve the  following deficits and impairments:           Visit Diagnosis: Other symptoms and signs involving cognitive functions following cerebral infarction  Visuospatial deficit  Other lack of coordination  Unsteadiness on feet  Muscle weakness (generalized)    Problem List Patient Active Problem List   Diagnosis Date Noted  . Acute CVA (cerebrovascular accident) (HCC) 05/22/2019  . Essential hypertension 05/22/2019  . Mild cognitive impairment with memory loss   . Radiculopathy   . Hypertension     Collier Salina, OTR/L 06/08/2019, 6:54 PM   [facility] [address] [place], [place], [address] Phone: [phone]   Fax:  [phone]  Name: Traniece Boffa MRN: [id number] Date of Birth: [birth year]

## 2019-06-09 ENCOUNTER — Ambulatory Visit: Payer: Medicare Other | Admitting: Occupational Therapy

## 2019-06-11 ENCOUNTER — Other Ambulatory Visit: Payer: Self-pay

## 2019-06-11 ENCOUNTER — Ambulatory Visit: Payer: Medicare Other | Admitting: Physical Therapy

## 2019-06-11 ENCOUNTER — Encounter: Payer: Self-pay | Admitting: Physical Therapy

## 2019-06-11 ENCOUNTER — Emergency Department (HOSPITAL_COMMUNITY): Payer: Medicare Other

## 2019-06-11 ENCOUNTER — Emergency Department (EMERGENCY_DEPARTMENT_HOSPITAL)
Admission: EM | Admit: 2019-06-11 | Discharge: 2019-06-12 | Disposition: A | Discharge status: Home / Self Care | Payer: Medicare Other | Source: Home / Self Care | Attending: Emergency Medicine | Admitting: Emergency Medicine

## 2019-06-11 ENCOUNTER — Encounter (HOSPITAL_COMMUNITY): Payer: Self-pay | Admitting: Emergency Medicine

## 2019-06-11 DIAGNOSIS — R2681 Unsteadiness on feet: Secondary | ICD-10-CM

## 2019-06-11 DIAGNOSIS — Z8673 Personal history of transient ischemic attack (TIA), and cerebral infarction without residual deficits: Secondary | ICD-10-CM | POA: Insufficient documentation

## 2019-06-11 DIAGNOSIS — I69318 Other symptoms and signs involving cognitive functions following cerebral infarction: Secondary | ICD-10-CM | POA: Diagnosis not present

## 2019-06-11 DIAGNOSIS — Z7982 Long term (current) use of aspirin: Secondary | ICD-10-CM | POA: Insufficient documentation

## 2019-06-11 DIAGNOSIS — H5340 Unspecified visual field defects: Secondary | ICD-10-CM | POA: Insufficient documentation

## 2019-06-11 DIAGNOSIS — Z79899 Other long term (current) drug therapy: Secondary | ICD-10-CM | POA: Insufficient documentation

## 2019-06-11 DIAGNOSIS — I10 Essential (primary) hypertension: Secondary | ICD-10-CM | POA: Insufficient documentation

## 2019-06-11 DIAGNOSIS — R278 Other lack of coordination: Secondary | ICD-10-CM

## 2019-06-11 DIAGNOSIS — M6281 Muscle weakness (generalized): Secondary | ICD-10-CM

## 2019-06-11 LAB — CBC
HCT: 43.9 % (ref 36.0–46.0)
Hemoglobin: 15 g/dL (ref 12.0–15.0)
MCH: 31 pg (ref 26.0–34.0)
MCHC: 34.2 g/dL (ref 30.0–36.0)
MCV: 90.7 fL (ref 80.0–100.0)
Platelets: 196 10*3/uL (ref 150–400)
RBC: 4.84 MIL/uL (ref 3.87–5.11)
RDW: 12.7 % (ref 11.5–15.5)
WBC: 6.2 10*3/uL (ref 4.0–10.5)
nRBC: 0 % (ref 0.0–0.2)

## 2019-06-11 LAB — COMPREHENSIVE METABOLIC PANEL
ALT: 23 U/L (ref 0–44)
AST: 29 U/L (ref 15–41)
Albumin: 3.9 g/dL (ref 3.5–5.0)
Alkaline Phosphatase: 52 U/L (ref 38–126)
Anion gap: 12 (ref 5–15)
BUN: 12 mg/dL (ref 8–23)
CO2: 22 mmol/L (ref 22–32)
Calcium: 9 mg/dL (ref 8.9–10.3)
Chloride: 101 mmol/L (ref 98–111)
Creatinine, Ser: 0.91 mg/dL (ref 0.44–1.00)
GFR calc Af Amer: >60 mL/min (ref >60)
GFR calc non Af Amer: 56 mL/min — ABNORMAL LOW (ref >60)
Glucose, Bld: 94 mg/dL (ref 70–99)
Potassium: 4.3 mmol/L (ref 3.5–5.1)
Sodium: 135 mmol/L (ref 135–145)
Total Bilirubin: 0.7 mg/dL (ref 0.3–1.2)
Total Protein: 6.1 g/dL — ABNORMAL LOW (ref 6.5–8.1)

## 2019-06-11 LAB — I-STAT CHEM 8, ED
BUN: 12 mg/dL (ref 8–23)
Calcium, Ion: 1.19 mmol/L (ref 1.15–1.40)
Chloride: 99 mmol/L (ref 98–111)
Creatinine, Ser: 0.9 mg/dL (ref 0.44–1.00)
Glucose, Bld: 92 mg/dL (ref 70–99)
HCT: 44 % (ref 36.0–46.0)
Hemoglobin: 15 g/dL (ref 12.0–15.0)
Potassium: 4 mmol/L (ref 3.5–5.1)
Sodium: 136 mmol/L (ref 135–145)
TCO2: 27 mmol/L (ref 22–32)

## 2019-06-11 LAB — PROTIME-INR
INR: 1 (ref 0.8–1.2)
Prothrombin Time: 13.3 seconds (ref 11.4–15.2)

## 2019-06-11 LAB — LACTIC ACID, PLASMA: Lactic Acid, Venous: 1.1 mmol/L (ref 0.5–1.9)

## 2019-06-11 MED ORDER — CLOPIDOGREL BISULFATE 75 MG PO TABS: 75.0000 mg | ORAL_TABLET | Freq: Every day | ORAL | 0 refills | Status: AC

## 2019-06-11 MED ORDER — IOHEXOL 350 MG/ML SOLN
75.0000 mL | Freq: Once | INTRAVENOUS | Status: AC | PRN
  Administered 2019-06-11: 22:00:00 75 mL via INTRAVENOUS

## 2019-06-11 MED ORDER — ATORVASTATIN CALCIUM 40 MG PO TABS: 80.0000 mg | ORAL_TABLET | Freq: Every day | ORAL | 0 refills | Status: AC

## 2019-06-11 MED ORDER — ASPIRIN EC 81 MG PO TBEC: 81.0000 mg | DELAYED_RELEASE_TABLET | Freq: Every day | ORAL | 0 refills | Status: AC

## 2019-06-11 NOTE — ED Provider Notes (Signed)
[facility] EMERGENCY DEPARTMENT Provider Note   CSN: [id number] Arrival date & time: 06/11/19  2010     History   Chief Complaint Chief Complaint  Patient presents with   Visual Field Change    HPI Vicki Cummings is a 85 y.o. female.     The history is provided by the patient.  Neurologic Problem This is a recurrent problem. The current episode started 3 to 5 hours ago. The problem has been resolved. Associated symptoms include headaches (mild, occiptial, aching). Pertinent negatives include no chest pain, no abdominal pain and no shortness of breath. Nothing (reading) aggravates the symptoms. The symptoms are relieved by rest and lying down.   Around 1745, patient had trouble reading and states that she "couldn't see the entire line of reading and the letters seemed to disappear." Patient recently had a TIA work up and possible acute stroke a few weeks ago.  In between hospital discharge and now, patient had a severe headache and reported sleeping for 15 hours in order to feel better.  When she woke up, patient's daughter reports that she has been more confused since her baseline.  Patient's daughter thinks that this may have been a stroke that was not evaluated.  She lives with her daughter currently, and she denies any missed doses of her plavix/aspirin (besides the dose tonight).  Past Medical History:  Diagnosis Date   H/O blood clots    "30-40 years ago"   Hypertension    Mild cognitive impairment with memory loss    Radiculopathy    TIA (transient ischemic attack)     Patient Active Problem List   Diagnosis Date Noted   Acute CVA (cerebrovascular accident) ([place]) 05/22/2019   Essential hypertension 05/22/2019   Mild cognitive impairment with memory loss    Radiculopathy    Hypertension     History reviewed. No pertinent surgical history.   OB History   No obstetric history on file.      Home Medications    Prior to Admission  medications   Medication Sig Start Date End Date Taking? Authorizing Provider  losartan (COZAAR) 25 MG tablet Take 25 mg by mouth daily. 03/30/19  Yes [provider]  MAGNESIUM OXIDE PO Take 1 capsule by mouth at bedtime.   Yes [provider]  Multiple Vitamin (MULTIVITAMIN WITH MINERALS) TABS tablet Take 1 tablet by mouth daily.   Yes [provider]  Omega-3 Fatty Acids (FISH OIL) 1000 MG CAPS Take 1,000 mg by mouth daily.   Yes [provider]  TURMERIC CURCUMIN PO Take 1 capsule by mouth daily.   Yes [provider]  aspirin EC 81 MG tablet Take 1 tablet (81 mg total) by mouth daily for 21 days. Stop after 3 months. 06/11/19 07/02/19  Julianne Rice, MD  atorvastatin (LIPITOR) 40 MG tablet Take 2 tablets (80 mg total) by mouth daily at 6 PM. 06/11/19   Julianne Rice, MD  clopidogrel (PLAVIX) 75 MG tablet Take 1 tablet (75 mg total) by mouth daily. Please take for the next three months. 06/11/19   Julianne Rice, MD    Family History Family History  Family history unknown: Yes    Social History Social History   Tobacco Use   Smoking status: Never Smoker   Smokeless tobacco: Never Used  Substance Use Topics   Alcohol use: Never    Frequency: Never   Drug use: Never     Allergies   Cephalexin, Banana, Kiwi extract,  Other, Penicillins, and Sulfa antibiotics   Review of Systems Review of Systems  Constitutional: Negative for fever.  Eyes: Positive for visual disturbance.  Respiratory: Negative for cough and shortness of breath.   Cardiovascular: Negative for chest pain.  Gastrointestinal: Negative for abdominal pain, nausea and vomiting.  Neurological: Positive for headaches (mild, occiptial, aching). Negative for syncope.  All other systems reviewed and are negative.    Physical Exam Updated Vital Signs BP (!) 191/77    Pulse 63    Temp 98.4 F (36.9 C) (Oral)    Resp 16    Ht 5\' 1"  (1.549 m)    Wt 55.3 kg     SpO2 100%    BMI 23.05 kg/m   Physical Exam Vitals signs and nursing note reviewed.  Constitutional:      General: She is not in acute distress.    Appearance: She is well-developed.  HENT:     Head: Normocephalic and atraumatic.  Eyes:     General: No visual field deficit.    Conjunctiva/sclera: Conjunctivae normal.  Neck:     Musculoskeletal: Neck supple.  Cardiovascular:     Rate and Rhythm: Normal rate and regular rhythm.     Heart sounds: No murmur.  Pulmonary:     Effort: Pulmonary effort is normal. No respiratory distress.     Breath sounds: Normal breath sounds.  Abdominal:     Palpations: Abdomen is soft.     Tenderness: There is no abdominal tenderness.  Skin:    General: Skin is warm and dry.  Neurological:     General: No focal deficit present.     Mental Status: She is alert and oriented to person, place, and time.     GCS: GCS eye subscore is 4. GCS verbal subscore is 5. GCS motor subscore is 6.     Cranial Nerves: No cranial nerve deficit or facial asymmetry.     Sensory: No sensory deficit.     Motor: No weakness.     Coordination: Coordination is intact. Coordination normal.      ED Treatments / Results  Labs (all labs ordered are listed, but only abnormal results are displayed) Labs Reviewed  COMPREHENSIVE METABOLIC PANEL - Abnormal; Notable for the following components:      Result Value   Total Protein 6.1 (*)    GFR calc non Af Amer 56 (*)    All other components within normal limits  CBC  LACTIC ACID, PLASMA  PROTIME-INR  LACTIC ACID, PLASMA  I-STAT CHEM 8, ED    EKG EKG Interpretation  Date/Time:  Thursday June 11 2019 20:55:25 EDT Ventricular Rate:  59 PR Interval:    QRS Duration: 90 QT Interval:  449 QTC Calculation: 445 R Axis:   45 Text Interpretation: Sinus rhythm Ventricular premature complex Minimal ST elevation, anterior leads Confirmed by 10-31-1993 [phone]) on 06/11/2019 9:01:36 PM   Radiology Ct Angio Head W  Or Wo Contrast  Result Date: 06/11/2019 CLINICAL DATA:  Right visual field cut. Headache. EXAM: CT ANGIOGRAPHY HEAD AND NECK TECHNIQUE: Multidetector CT imaging of the head and neck was performed using the standard protocol during bolus administration of intravenous contrast. Multiplanar CT image reconstructions and MIPs were obtained to evaluate the vascular anatomy. Carotid stenosis measurements (when applicable) are obtained utilizing NASCET criteria, using the distal internal carotid diameter as the denominator. CONTRAST:  40mL OMNIPAQUE IOHEXOL 350 MG/ML SOLN COMPARISON:  None. FINDINGS: CT HEAD FINDINGS Brain: There is no mass, hemorrhage or  extra-axial collection. There is generalized atrophy without lobar predilection. There is no acute or chronic infarction. There is hypoattenuation of the periventricular white matter, most commonly indicating chronic ischemic microangiopathy. Skull: The visualized skull base, calvarium and extracranial soft tissues are normal. Sinuses/Orbits: No fluid levels or advanced mucosal thickening of the visualized paranasal sinuses. No mastoid or middle ear effusion. There are bilateral lens replacements. CTA NECK FINDINGS SKELETON: There is no bony spinal canal stenosis. No lytic or blastic lesion. OTHER NECK: There is a heterogeneous right thyroid nodule measuring 2.9 x 1.9 cm. UPPER CHEST: No pneumothorax or pleural effusion. No nodules or masses. AORTIC ARCH: There is no calcific atherosclerosis of the aortic arch. There is no aneurysm, dissection or hemodynamically significant stenosis of the visualized portion of the aorta. Conventional 3 vessel aortic branching pattern. The visualized proximal subclavian arteries are widely patent. RIGHT CAROTID SYSTEM: No dissection, occlusion or aneurysm. There is predominantly calcified atherosclerosis extending into the proximal ICA, resulting in 50% stenosis. LEFT CAROTID SYSTEM: Normal without aneurysm, dissection or stenosis.  VERTEBRAL ARTERIES: Codominant configuration. Both origins are clearly patent. There is no dissection, occlusion or flow-limiting stenosis to the skull base (V1-V3 segments). CTA HEAD FINDINGS POSTERIOR CIRCULATION: --Vertebral arteries: Normal V4 segments. --Posterior inferior cerebellar arteries (PICA): Patent origins from the vertebral arteries. --Anterior inferior cerebellar arteries (AICA): Patent origins from the basilar artery. --Basilar artery: Normal. --Superior cerebellar arteries: Normal. --Posterior cerebral arteries: Normal. Both originate from the basilar artery. Posterior communicating arteries (p-comm) are diminutive or absent. ANTERIOR CIRCULATION: --Intracranial internal carotid arteries: Normal. --Anterior cerebral arteries (ACA): Normal. Both A1 segments are present. Patent anterior communicating artery (a-comm). --Middle cerebral arteries (MCA): Normal. VENOUS SINUSES: As permitted by contrast timing, patent. ANATOMIC VARIANTS: None Review of the MIP images confirms the above findings. IMPRESSION: 1. No intracranial arterial occlusion or high-grade stenosis. 2. Approximately 50% stenosis of the proximal right internal carotid artery secondary to predominantly calcified atherosclerosis. 3. Heterogeneous right thyroid nodule measuring 2.9 x 1.9 cm. Further evaluation with thyroid ultrasound is recommended on a nonemergent basis. Electronically Signed   By: Deatra RobinsonKevin  Herman M.D.   On: 06/11/2019 22:26   Ct Angio Neck W And/or Wo Contrast  Result Date: 06/11/2019 CLINICAL DATA:  Right visual field cut. Headache. EXAM: CT ANGIOGRAPHY HEAD AND NECK TECHNIQUE: Multidetector CT imaging of the head and neck was performed using the standard protocol during bolus administration of intravenous contrast. Multiplanar CT image reconstructions and MIPs were obtained to evaluate the vascular anatomy. Carotid stenosis measurements (when applicable) are obtained utilizing NASCET criteria, using the distal  internal carotid diameter as the denominator. CONTRAST:  75mL OMNIPAQUE IOHEXOL 350 MG/ML SOLN COMPARISON:  None. FINDINGS: CT HEAD FINDINGS Brain: There is no mass, hemorrhage or extra-axial collection. There is generalized atrophy without lobar predilection. There is no acute or chronic infarction. There is hypoattenuation of the periventricular white matter, most commonly indicating chronic ischemic microangiopathy. Skull: The visualized skull base, calvarium and extracranial soft tissues are normal. Sinuses/Orbits: No fluid levels or advanced mucosal thickening of the visualized paranasal sinuses. No mastoid or middle ear effusion. There are bilateral lens replacements. CTA NECK FINDINGS SKELETON: There is no bony spinal canal stenosis. No lytic or blastic lesion. OTHER NECK: There is a heterogeneous right thyroid nodule measuring 2.9 x 1.9 cm. UPPER CHEST: No pneumothorax or pleural effusion. No nodules or masses. AORTIC ARCH: There is no calcific atherosclerosis of the aortic arch. There is no aneurysm, dissection or hemodynamically significant stenosis of the visualized  portion of the aorta. Conventional 3 vessel aortic branching pattern. The visualized proximal subclavian arteries are widely patent. RIGHT CAROTID SYSTEM: No dissection, occlusion or aneurysm. There is predominantly calcified atherosclerosis extending into the proximal ICA, resulting in 50% stenosis. LEFT CAROTID SYSTEM: Normal without aneurysm, dissection or stenosis. VERTEBRAL ARTERIES: Codominant configuration. Both origins are clearly patent. There is no dissection, occlusion or flow-limiting stenosis to the skull base (V1-V3 segments). CTA HEAD FINDINGS POSTERIOR CIRCULATION: --Vertebral arteries: Normal V4 segments. --Posterior inferior cerebellar arteries (PICA): Patent origins from the vertebral arteries. --Anterior inferior cerebellar arteries (AICA): Patent origins from the basilar artery. --Basilar artery: Normal. --Superior  cerebellar arteries: Normal. --Posterior cerebral arteries: Normal. Both originate from the basilar artery. Posterior communicating arteries (p-comm) are diminutive or absent. ANTERIOR CIRCULATION: --Intracranial internal carotid arteries: Normal. --Anterior cerebral arteries (ACA): Normal. Both A1 segments are present. Patent anterior communicating artery (a-comm). --Middle cerebral arteries (MCA): Normal. VENOUS SINUSES: As permitted by contrast timing, patent. ANATOMIC VARIANTS: None Review of the MIP images confirms the above findings. IMPRESSION: 1. No intracranial arterial occlusion or high-grade stenosis. 2. Approximately 50% stenosis of the proximal right internal carotid artery secondary to predominantly calcified atherosclerosis. 3. Heterogeneous right thyroid nodule measuring 2.9 x 1.9 cm. Further evaluation with thyroid ultrasound is recommended on a nonemergent basis. Electronically Signed   By: Deatra Robinson M.D.   On: 06/11/2019 22:26    Procedures Procedures (including critical care time)  Medications Ordered in ED Medications  iohexol (OMNIPAQUE) 350 MG/ML injection 75 mL (75 mLs Intravenous Contrast Given 06/11/19 2154)     Initial Impression / Assessment and Plan / ED Course  I have reviewed the triage vital signs and the nursing notes.  Pertinent labs & imaging results that were available during my care of the patient were reviewed by me and considered in my medical decision making (see chart for details).        Vicki Cummings is a 85 y.o. female with recent CVA presents today for concern for repeat CVA at home as well as a TIA like episode that last began at 97.  Presentation consistent with TIA versus CVA.  Symptoms have now resolved, but given history and physical exam, will order a CT head and neck in conjunction with neurology consult.  CT scans not show any acute stroke, but considering symptoms and stenosis present, neurology recommends an MRI with and without  contrast.  Either way, neurology recommends continuing Plavix and aspirin for the next 3 months.  Increase Lipitor from 40 mg to 80 mg.  Follow-up with neurology stroke clinic. Patient is signed out to the next provider.  Care of patient discussed with the supervising attending.  Final Clinical Impressions(s) / ED Diagnoses   Final diagnoses:  Visual field cut    ED Discharge Orders         Ordered    aspirin EC 81 MG tablet  Daily     06/11/19 2339    atorvastatin (LIPITOR) 40 MG tablet  Daily-1800     06/11/19 2339    clopidogrel (PLAVIX) 75 MG tablet  Daily     06/11/19 2339    Ambulatory referral to Neurology    Comments: An appointment is requested in approximately: 1 week   06/11/19 2340           Chester Holstein, MD 06/11/19 0454    Tilden Fossa, MD 06/13/19 1757

## 2019-06-11 NOTE — ED Provider Notes (Signed)
85 year old female received at sign out from Dr. Sherril Croon, resident, under the supervision of Dr. Ralene Bathe. Per her HPI:   "Vicki Cummings is a 85 y.o. female.   The history is provided by the patient.  Neurologic Problem This is a recurrent problem. The current episode started 3 to 5 hours ago. The problem has been resolved. Associated symptoms include headaches (mild, occiptial, aching). Pertinent negatives include no chest pain, no abdominal pain and no shortness of breath. Nothing (reading) aggravates the symptoms. The symptoms are relieved by rest and lying down.   Around 1745, patient had trouble reading and states that she "couldn't see the entire line of reading and the letters seemed to disappear." Patient recently had a TIA work up and possible acute stroke a few weeks ago.  In between hospital discharge and now, patient had a severe headache and reported sleeping for 15 hours in order to feel better.  When she woke up, patient's daughter reports that she has been more confused since her baseline.  Patient's daughter thinks that this may have been a stroke that was not evaluated.  She lives with her daughter currently, and she denies any missed doses of her plavix/aspirin (besides the dose tonight)."  Physical Exam  BP (!) 165/78   Pulse (!) 50   Temp 98.4 F (36.9 C) (Oral)   Resp 17   Ht 5\' 1"  (1.549 m)   Wt 55.3 kg   SpO2 98%   BMI 23.05 kg/m   Physical Exam Vitals signs and nursing note reviewed.  Constitutional:      General: She is not in acute distress.    Comments: NAD   HENT:     Head: Normocephalic.  Eyes:     Conjunctiva/sclera: Conjunctivae normal.  Neck:     Musculoskeletal: Neck supple.  Cardiovascular:     Rate and Rhythm: Normal rate and regular rhythm.     Heart sounds: No murmur. No friction rub. No gallop.   Pulmonary:     Effort: Pulmonary effort is normal. No respiratory distress.  Abdominal:     General: There is no distension.     Palpations:  Abdomen is soft.  Skin:    General: Skin is warm.     Findings: No rash.  Neurological:     Mental Status: She is alert.  Psychiatric:        Behavior: Behavior normal.     ED Course/Procedures     Procedures  MDM   85 year old female received at signout pending MRI.  Please see Dr. De Hollingshead note for further work-up and medical decision making.  MRI is negative for acute stroke.  MRI was discussed with Dr. Lorraine Lax, neurology.  Patient can be discharged at this time.  ER return precautions given.  All questions answered.  She can follow-up with neurology in the clinic.     Joanne Gavel, PA-C 16/01/09 3235    Delora Fuel, MD 57/32/20 [phone]

## 2019-06-11 NOTE — Discharge Instructions (Addendum)
Please increase your Lipitor from 40 mg to 80 mg daily.    Continue your aspirin and Plavix for the next 3 months.    You should be called to make an appointment with the neurology clinic.  Return to the emergency department if you have new or worsening symptoms.

## 2019-06-11 NOTE — ED Triage Notes (Signed)
[facility] reports that the patient around 5:45 pm she was reading a letter and lost the right side of her vision and a gradual headache. Episode lasted about 5 minutes. She had unsteady gait for a brief time. She was treated for a TIA about 3 weeks ago. Her symptoms have since resolved. No weakness or slurred speech. Her only current complaint is that she needs to eat and take her night medication.  Vitals:  Bp: 170/76 HR: 60 NSR CBG: 97 98% RA  RR- 18

## 2019-06-11 NOTE — Therapy (Signed)
[facility] [address] [place], [place], [address] Phone: [phone]   Fax:  [phone]  Physical Therapy Treatment  Patient Details  Name: Vicki Cummings MRN: [id number] Date of Birth: 1926/01/05 Referring Provider (PT): Edrick Oh, MD   Encounter Date: 06/11/2019  PT End of Session - 06/11/19 1201    Visit Number  2    Number of Visits  9    Date for PT Re-Evaluation  07/31/19   only plan to see for 30 days   Authorization Type  Medicare and HOP supplement: 10th visit progress note    PT Start Time  1016    PT Stop Time  1100    PT Time Calculation (min)  44 min    Equipment Utilized During Treatment  Gait belt    Activity Tolerance  Patient tolerated treatment well    Behavior During Therapy  [facility] for tasks assessed/performed       Past Medical History:  Diagnosis Date  . H/O blood clots    "30-40 years ago"  . Hypertension   . Mild cognitive impairment with memory loss   . Radiculopathy   . TIA (transient ischemic attack)     History reviewed. No pertinent surgical history.  There were no vitals filed for this visit.  Subjective Assessment - 06/11/19 1019    Subjective  Not using her RW anymore except at night for safety reasons. Feels like she is doing really well - especially with her walking and her balance.    Patient is accompained by:  Family member   [place]   Limitations  Walking    How long can you walk comfortably?  has not walked for exercise yet.    Patient Stated Goals  Pt would like to get better enough to move back to [place].    Currently in Pain?  No/denies                       [facility] Adult PT Treatment/Exercise - 06/11/19 0001      Neuro Re-ed    Neuro Re-ed Details   On blue floor mat for compliant surface: massed practice B SLS taps to colorful bubbles on floor, progressing from 2 to 3 and single tap to double tap - increased difficulty performing on R LE and  intermittent need for UE support at // bars. In // bars: on rockerboard A/P weight shifts with no UE support, progressing to static holds with alternating UE lifts.         Access Code: [email or web address]  URL: [email or web address]  Date: 06/11/2019  Prepared by: Janann August   Initiated HEP for balance in the corner and at countertop.   Exercises Heel Walking - 3 sets - 2x daily - 7x weekly Toe Walking - 3 sets - 2x daily - 7x weekly Tandem Walking with Counter Support - 3 sets - 2x daily - 7x weekly Standing Single Leg Stance with Counter Support - 3 sets - 10 hold - 2x daily - 7x weekly Tandem Stance - 3 sets - 15 hold - 2x daily - 7x weekly Romberg Stance Eyes Closed on Foam Pad - 10 reps - 2 sets - 2x daily - 7x  -with 10 reps head nods up and down, 10 reps head turns side to side.       PT Education - 06/11/19 1201    Education Details  initial HEP for balance.    Person(s) Educated  Patient;Child(ren)  Methods  Explanation;Demonstration;Handout    Comprehension  Verbalized understanding;Returned demonstration          PT Long Term Goals - 06/01/19 1542      PT LONG TERM GOAL #1   Title  Pt will be independent with HEP in order to indicate decreased fall risk and improved functional mobility. (Target Date: 07/01/19)    Time  4    Period  Weeks    Status  New    Target Date  07/01/19      PT LONG TERM GOAL #2   Title  Pt will improve gait speed to 4.37 ft/sec without AD in order to indicate normal gait speed.      PT LONG TERM GOAL #3   Title  Pt will improve FGA to >/=23/30 in order to indicate decreased fall risk.      PT LONG TERM GOAL #4   Title  Pt will ambulate >1000' independently over varying indoor and outdoor surfaces in order to indicate safe return to community and leisure activity.            Plan - 06/11/19 1202    Clinical Impression Statement  Focus of today's skilled session was initiating counter and corner balance HEP. Pt  needed initial cueing for technique and cues to perform slowly, as pt has tendency to perform quickly without cueing. Remainder of session focused on balance on compliant surfaces with focus on weight shifting and SLS requiring min guard. Pt remains very motivated and will continue to progress towards LTGs.    Personal Factors and Comorbidities  Age;Comorbidity 1    Comorbidities  HTN    Examination-Activity Limitations  Stairs;Locomotion Level;Carry    Examination-Participation Restrictions  Driving;Shop;Other   exercise   Stability/Clinical Decision Making  Stable/Uncomplicated    Rehab Potential  Excellent    PT Frequency  2x / week    PT Duration  4 weeks    PT Treatment/Interventions  ADLs/Self Care Home Management;Gait training;Stair training;Functional mobility training;Therapeutic activities;Therapeutic exercise;Balance training;Neuromuscular re-education;Patient/family education    PT Next Visit Plan  HIGH level balance, transitions between varying surfaces, compliant sufaces, SLS and eyes closed. states she has been walking on treadmill at home at 3.0 mph and slight incline - make sure this is safe/proper parameters.  lots of fun to work with!    PT Home Exercise Plan  [email or web address]    Consulted and Agree with Plan of Care  Patient;Family member/caregiver    Family Member Consulted  daughter Harriett Sine       Patient will benefit from skilled therapeutic intervention in order to improve the following deficits and impairments:  Decreased activity tolerance, Decreased balance, Decreased endurance, Decreased safety awareness, Decreased strength  Visit Diagnosis: Other lack of coordination  Unsteadiness on feet  Muscle weakness (generalized)     Problem List Patient Active Problem List   Diagnosis Date Noted  . Acute CVA (cerebrovascular accident) ([facility]) 05/22/2019  . Essential hypertension 05/22/2019  . Mild cognitive impairment with memory loss   . Radiculopathy   . Hypertension      Drake Leach, PT, DPT  06/11/2019, 12:10 PM  [facility] [address] [place], [place], [address] Phone: [phone]   Fax:  [phone]  Name: Vicki Cummings MRN: [id number] Date of Birth: [birth year]

## 2019-06-11 NOTE — ED Notes (Signed)
Went to MRI 

## 2019-06-11 NOTE — Patient Instructions (Signed)
Access Code: [email or web address]  URL: [email or web address]  Date: 06/11/2019  Prepared by: Janann August   Exercises Heel Walking - 3 sets - 2x daily - 7x weekly Toe Walking - 3 sets - 2x daily - 7x weekly Tandem Walking with Counter Support - 3 sets - 2x daily - 7x weekly Standing Single Leg Stance with Counter Support - 3 sets - 10 hold - 2x daily - 7x weekly Tandem Stance - 3 sets - 15 hold - 2x daily - 7x weekly Romberg Stance Eyes Closed on Foam Pad - 10 reps - 2 sets - 2x daily - 7x weekly

## 2019-06-12 ENCOUNTER — Encounter: Payer: Self-pay | Admitting: Physical Therapy

## 2019-06-12 ENCOUNTER — Emergency Department (HOSPITAL_COMMUNITY): Payer: Medicare Other

## 2019-06-12 ENCOUNTER — Ambulatory Visit: Payer: Medicare Other | Admitting: Physical Therapy

## 2019-06-12 VITALS — BP 123/60 | HR 92

## 2019-06-12 DIAGNOSIS — I69318 Other symptoms and signs involving cognitive functions following cerebral infarction: Secondary | ICD-10-CM | POA: Diagnosis not present

## 2019-06-12 DIAGNOSIS — R2681 Unsteadiness on feet: Secondary | ICD-10-CM

## 2019-06-12 DIAGNOSIS — R2689 Other abnormalities of gait and mobility: Secondary | ICD-10-CM

## 2019-06-12 MED ORDER — GADOBUTROL 1 MMOL/ML IV SOLN
6.0000 mL | Freq: Once | INTRAVENOUS | Status: AC | PRN
  Administered 2019-06-12: 6 mL via INTRAVENOUS

## 2019-06-12 NOTE — Consult Note (Signed)
Requesting Physician: Dr. Pedro EarlsMia McDonnald PA-C    Chief Complaint: Visual loss and right palpable visual field  History obtained from: Patient and Chart     HPI:                                                                                                                                       Pasty ArchSuzanne Closson is a 85 y.o. female with past medical history of hypertension, radiculopathy, recent left pontine infarction with intracranial atherosclerotic disease on MRA presents to the emergency department with sudden onset visual field deficits in the right side.  Last known normal was 5:45 PM.  Patient was reading a book and suddenly she realized that she could not see the words on the right side of the book.  She states that she had to move the book around to complete the sentence which was unusual and her daughter who was with her noticed that she was neglecting the right side.  Daughter was concerned about another stroke and brought patient to the emergency room.  Her symptoms had already is resolved and the entire episode lasted only 15 to 30 minutes.Currently patient is back to her baseline.  Patient was discharged on aspirin and Plavix for 3 weeks as etiology of stroke during last admission was thought to be small vessel disease.  Patient just had completed dual therapy and is now only on Plavix 75 mg patient did have some mild nosebleeds with aspirin and discontinued it 2 days early.    Date last known well: 06/11/19 Time last known well: 5:45 PM tPA Given: No symptoms have resolved NIHSS: Symptoms resolved, currently 0 Baseline MRS 1  Past Medical History:  Diagnosis Date  . H/O blood clots    "30-40 years ago"  . Hypertension   . Mild cognitive impairment with memory loss   . Radiculopathy   . TIA (transient ischemic attack)     History reviewed. No pertinent surgical history.  Family History  Family history unknown: Yes   Social History:  reports that she has never smoked. She has  never used smokeless tobacco. She reports that she does not drink alcohol or use drugs.  Allergies:  Allergies  Allergen Reactions  . Cephalexin Other (See Comments)    Reaction not recalled  . Banana Itching, Rash and Other (See Comments)    Non-organic bannas  . Kiwi Extract Itching and Rash  . Other Itching and Rash    Mangos and poison ivy, also  Pesticide  . Penicillins Rash    Did it involve swelling of the face/tongue/throat, SOB, or low BP? No, but extensive rash Did it involve sudden or severe rash/hives, skin peeling, or any reaction on the inside of your mouth or nose? No Did you need to seek medical attention at a hospital or doctor's office? No When did it last happen? Over 60 years ago If all above answers  are "NO", may proceed with cephalosporin use.   . Sulfa Antibiotics Hives and Rash    Medications:                                                                                                                        I reviewed home medications   ROS:                                                                                                                                     14 systems reviewed and negative except above    Examination:                                                                                                      General: Appears well-developed  Psych: Affect appropriate to situation Eyes: No scleral injection HENT: No OP obstrucion Head: Normocephalic.  Cardiovascular: Normal rate and regular rhythm.  Respiratory: Effort normal and breath sounds normal to anterior ascultation GI: Soft.  No distension. There is no tenderness.  Skin: WDI    Neurological Examination Mental Status: Alert, oriented, thought content appropriate.  Speech fluent without evidence of aphasia. Able to follow 3 step commands without difficulty. Cranial Nerves: II: Visual fields grossly normal,  III,IV, VI: ptosis not present, extra-ocular motions  intact bilaterally, pupils equal, round, reactive to light and accommodation V,VII: smile symmetric, facial light touch sensation normal bilaterally VIII: hearing normal bilaterally IX,X: uvula rises symmetrically XI: bilateral shoulder shrug XII: midline tongue extension Motor: Right : Upper extremity   5/5    Left:     Upper extremity   5/5  Lower extremity   5/5     Lower extremity   5/5 Tone and bulk:normal tone throughout; no atrophy noted Sensory: Pinprick and light touch intact throughout, bilaterally Deep Tendon Reflexes: 2+ and symmetric throughout Plantars: Right: downgoing   Left: downgoing Cerebellar: normal finger-to-nose, normal rapid alternating movements and normal heel-to-shin test Gait: normal gait and station  Lab Results: Basic Metabolic Panel: Recent Labs  Lab 06/11/19 12/18/2040 06/11/19 2142  NA 135 136  K 4.3 4.0  CL 101 99  CO2 22  --   GLUCOSE 94 92  BUN 12 12  CREATININE 0.91 0.90  CALCIUM 9.0  --     CBC: Recent Labs  Lab 06/11/19 12/18/40 06/11/19 2142  WBC 6.2  --   HGB 15.0 15.0  HCT 43.9 44.0  MCV 90.7  --   PLT 196  --     Coagulation Studies: Recent Labs    06/11/19 2040/12/18  LABPROT 13.3  INR 1.0    Imaging: Ct Angio Head W Or Wo Contrast  Result Date: 06/11/2019 CLINICAL DATA:  Right visual field cut. Headache. EXAM: CT ANGIOGRAPHY HEAD AND NECK TECHNIQUE: Multidetector CT imaging of the head and neck was performed using the standard protocol during bolus administration of intravenous contrast. Multiplanar CT image reconstructions and MIPs were obtained to evaluate the vascular anatomy. Carotid stenosis measurements (when applicable) are obtained utilizing NASCET criteria, using the distal internal carotid diameter as the denominator. CONTRAST:  32mL OMNIPAQUE IOHEXOL 350 MG/ML SOLN COMPARISON:  None. FINDINGS: CT HEAD FINDINGS Brain: There is no mass, hemorrhage or extra-axial collection. There is generalized atrophy without lobar  predilection. There is no acute or chronic infarction. There is hypoattenuation of the periventricular white matter, most commonly indicating chronic ischemic microangiopathy. Skull: The visualized skull base, calvarium and extracranial soft tissues are normal. Sinuses/Orbits: No fluid levels or advanced mucosal thickening of the visualized paranasal sinuses. No mastoid or middle ear effusion. There are bilateral lens replacements. CTA NECK FINDINGS SKELETON: There is no bony spinal canal stenosis. No lytic or blastic lesion. OTHER NECK: There is a heterogeneous right thyroid nodule measuring 2.9 x 1.9 cm. UPPER CHEST: No pneumothorax or pleural effusion. No nodules or masses. AORTIC ARCH: There is no calcific atherosclerosis of the aortic arch. There is no aneurysm, dissection or hemodynamically significant stenosis of the visualized portion of the aorta. Conventional 3 vessel aortic branching pattern. The visualized proximal subclavian arteries are widely patent. RIGHT CAROTID SYSTEM: No dissection, occlusion or aneurysm. There is predominantly calcified atherosclerosis extending into the proximal ICA, resulting in 50% stenosis. LEFT CAROTID SYSTEM: Normal without aneurysm, dissection or stenosis. VERTEBRAL ARTERIES: Codominant configuration. Both origins are clearly patent. There is no dissection, occlusion or flow-limiting stenosis to the skull base (V1-V3 segments). CTA HEAD FINDINGS POSTERIOR CIRCULATION: --Vertebral arteries: Normal V4 segments. --Posterior inferior cerebellar arteries (PICA): Patent origins from the vertebral arteries. --Anterior inferior cerebellar arteries (AICA): Patent origins from the basilar artery. --Basilar artery: Normal. --Superior cerebellar arteries: Normal. --Posterior cerebral arteries: Normal. Both originate from the basilar artery. Posterior communicating arteries (p-comm) are diminutive or absent. ANTERIOR CIRCULATION: --Intracranial internal carotid arteries: Normal.  --Anterior cerebral arteries (ACA): Normal. Both A1 segments are present. Patent anterior communicating artery (a-comm). --Middle cerebral arteries (MCA): Normal. VENOUS SINUSES: As permitted by contrast timing, patent. ANATOMIC VARIANTS: None Review of the MIP images confirms the above findings. IMPRESSION: 1. No intracranial arterial occlusion or high-grade stenosis. 2. Approximately 50% stenosis of the proximal right internal carotid artery secondary to predominantly calcified atherosclerosis. 3. Heterogeneous right thyroid nodule measuring 2.9 x 1.9 cm. Further evaluation with thyroid ultrasound is recommended on a nonemergent basis. Electronically Signed   By: Deatra Robinson M.D.   On: 06/11/2019 22:26   Ct Angio Neck W And/or Wo Contrast  Result Date: 06/11/2019 CLINICAL DATA:  Right visual field cut. Headache. EXAM: CT  ANGIOGRAPHY HEAD AND NECK TECHNIQUE: Multidetector CT imaging of the head and neck was performed using the standard protocol during bolus administration of intravenous contrast. Multiplanar CT image reconstructions and MIPs were obtained to evaluate the vascular anatomy. Carotid stenosis measurements (when applicable) are obtained utilizing NASCET criteria, using the distal internal carotid diameter as the denominator. CONTRAST:  75mL OMNIPAQUE IOHEXOL 350 MG/ML SOLN COMPARISON:  None. FINDINGS: CT HEAD FINDINGS Brain: There is no mass, hemorrhage or extra-axial collection. There is generalized atrophy without lobar predilection. There is no acute or chronic infarction. There is hypoattenuation of the periventricular white matter, most commonly indicating chronic ischemic microangiopathy. Skull: The visualized skull base, calvarium and extracranial soft tissues are normal. Sinuses/Orbits: No fluid levels or advanced mucosal thickening of the visualized paranasal sinuses. No mastoid or middle ear effusion. There are bilateral lens replacements. CTA NECK FINDINGS SKELETON: There is no bony  spinal canal stenosis. No lytic or blastic lesion. OTHER NECK: There is a heterogeneous right thyroid nodule measuring 2.9 x 1.9 cm. UPPER CHEST: No pneumothorax or pleural effusion. No nodules or masses. AORTIC ARCH: There is no calcific atherosclerosis of the aortic arch. There is no aneurysm, dissection or hemodynamically significant stenosis of the visualized portion of the aorta. Conventional 3 vessel aortic branching pattern. The visualized proximal subclavian arteries are widely patent. RIGHT CAROTID SYSTEM: No dissection, occlusion or aneurysm. There is predominantly calcified atherosclerosis extending into the proximal ICA, resulting in 50% stenosis. LEFT CAROTID SYSTEM: Normal without aneurysm, dissection or stenosis. VERTEBRAL ARTERIES: Codominant configuration. Both origins are clearly patent. There is no dissection, occlusion or flow-limiting stenosis to the skull base (V1-V3 segments). CTA HEAD FINDINGS POSTERIOR CIRCULATION: --Vertebral arteries: Normal V4 segments. --Posterior inferior cerebellar arteries (PICA): Patent origins from the vertebral arteries. --Anterior inferior cerebellar arteries (AICA): Patent origins from the basilar artery. --Basilar artery: Normal. --Superior cerebellar arteries: Normal. --Posterior cerebral arteries: Normal. Both originate from the basilar artery. Posterior communicating arteries (p-comm) are diminutive or absent. ANTERIOR CIRCULATION: --Intracranial internal carotid arteries: Normal. --Anterior cerebral arteries (ACA): Normal. Both A1 segments are present. Patent anterior communicating artery (a-comm). --Middle cerebral arteries (MCA): Normal. VENOUS SINUSES: As permitted by contrast timing, patent. ANATOMIC VARIANTS: None Review of the MIP images confirms the above findings. IMPRESSION: 1. No intracranial arterial occlusion or high-grade stenosis. 2. Approximately 50% stenosis of the proximal right internal carotid artery secondary to predominantly calcified  atherosclerosis. 3. Heterogeneous right thyroid nodule measuring 2.9 x 1.9 cm. Further evaluation with thyroid ultrasound is recommended on a nonemergent basis. Electronically Signed   By: Deatra Robinson M.D.   On: 06/11/2019 22:26   Mr Laqueta Jean And Wo Contrast  Result Date: 06/12/2019 CLINICAL DATA:  Neuro deficit(s), subacute EXAM: MRI HEAD WITHOUT AND WITH CONTRAST TECHNIQUE: Multiplanar, multiecho pulse sequences of the brain and surrounding structures were obtained without and with intravenous contrast. CONTRAST:  6mL GADAVIST GADOBUTROL 1 MMOL/ML IV SOLN COMPARISON:  brain MRI 05/22/2019 FINDINGS: BRAIN: Normalizing diffusion-weighted signal at the site of late subacute left pontine infarct. Early confluent hyperintense T2-weighted signal of the periventricular and deep white matter, most commonly due to chronic ischemic microangiopathy. There is generalized atrophy without lobar predilection. The midline structures are normal. Mild contrast enhancement at the prior infarct site, consistent with subacute timeline. VASCULAR: The major intracranial arterial and venous sinus flow voids are normal. Susceptibility-sensitive sequences show no chronic microhemorrhage or superficial siderosis. SKULL AND UPPER CERVICAL SPINE: Calvarial bone marrow signal is normal. There is no skull base mass. The  visualized upper cervical spine and soft tissues are normal. SINUSES/ORBITS: There are no fluid levels or advanced mucosal thickening. The mastoid air cells and middle ear cavities are free of fluid. The orbits are normal. IMPRESSION: 1. No acute abnormality 2. Late subacute left pontine infarct. No hemorrhage or mass effect. 3. Moderate chronic ischemic microangiopathy. Electronically Signed   By: Ulyses Jarred M.D.   On: 06/12/2019 00:48     ASSESSMENT AND PLAN  85 y.o. female with past medical history of hypertension, radiculopathy, recent left pontine infarction with intracranial atherosclerotic disease on MRA  presents to the emergency department with sudden onset visual field deficits in the right side.  Based on description of symptoms, this localizes to the left PCA vascular phenomenon.  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  CT angiogram was performed, read this time with no intracranial atherosclerosis.  However, I do think there is some atherosclerotic disease and recommend patient to restart her aspirin in addition to Plavix and follow-up with stroke neurology. I would also consider a 30-day monitor for potential cardiac source for emboli.  Discussed option of patient staying for observation, she is already been in the emergency room for more than 12 hours and patient is eager to go home with daughter stating that she will return if patient has any acute symptoms.  After discussing risk versus benefit patient can go home if MRI brain is negative for any acute infarct.   Addendum: MRI brain does not show any acute stroke.  Transient ischemic attack -Restart aspirin 81 and Plavix 75mg   into 3 months Increase statin from 40 mg to 80 mg daily Consider 30-day Holter monitor Outpatient stroke follow-up  Sushanth Aroor [facility] Pager Number [phone]

## 2019-06-12 NOTE — Therapy (Signed)
[facility] [address] [place], [place], [address] Phone: [phone]   Fax:  [phone]  Physical Therapy Treatment  Patient Details  Name: Vicki Cummings MRN: [id number] Date of Birth: 23-Apr-1926 Referring Provider (PT): Edrick Oh, MD   Encounter Date: 06/12/2019  PT End of Session - 06/12/19 1423    Visit Number  3    Number of Visits  9    Date for PT Re-Evaluation  07/31/19   only plan to see for 30 days   Authorization Type  Medicare and HOP supplement: 10th visit progress note    PT Start Time  1318    PT Stop Time  1415    PT Time Calculation (min)  57 min    Equipment Utilized During Treatment  Gait belt    Activity Tolerance  Patient tolerated treatment well    Behavior During Therapy  [facility] for tasks assessed/performed       Past Medical History:  Diagnosis Date  . H/O blood clots    "30-40 years ago"  . Hypertension   . Mild cognitive impairment with memory loss   . Radiculopathy   . TIA (transient ischemic attack)     History reviewed. No pertinent surgical history.  Vitals:   06/12/19 1334 06/12/19 1345 06/12/19 1413  BP: (!) 116/59 (!) 188/72 123/60  Pulse: 66 90 92    Subjective Assessment - 06/12/19 1322    Subjective  Pt had to go to ED last night. She was reading but lost the right side field of vision then difficulty with word finding/reading words.  Pt also had some weakness and headache.  MD feels like it was cerebral small vessel disease or "extension of stroke".    Patient is accompained by:  Family member   [place]   Limitations  Walking    How long can you walk comfortably?  has not walked for exercise yet.    Patient Stated Goals  Pt would like to get better enough to move back to [place].    Currently in Pain?  No/denies         [facility] Adult PT Treatment/Exercise - 06/12/19 0001      Transfers   Transfers  Sit to Stand;Stand to Sit    Sit to Stand  7: Independent    Stand  to Sit  7: Independent    Comments  Sit<>stand x 10 reps on blue Airdex without UE.  Several times pt pushing back against mat for balance.      Ambulation/Gait   Ambulation/Gait  Yes    Ambulation/Gait Assistance  5: Supervision    Ambulation/Gait Assistance Details  cues for staying close enough to front of treadmill as well as discussion on how to use treadmill to challenge balance.  Pt tends to prefer to hold on tightly with bil UE's at increased speed.  Discussed slowing treadmill down to a level where she can hold with one hand and perform arm swing on the other hand while maintaining safetyl  Pt agrees to try this at home.    Ambulation Distance (Feet)  --   treadmill plus between various stations in gym   Assistive device  None;Other (Comment)   treadmill   Gait Pattern  Step-through pattern;Decreased stride length    Ambulation Surface  Level;Indoor    Gait velocity  Treadmill at 3.0-3.3 mph with 1% incline.  Decreased to 2.7 mph for 1 UE armswing      High Level  Balance   High Level Balance Activities  Side stepping;Braiding;Tandem walking;Other (comment)   backwards tandem walking   High Level Balance Comments  SLS x 10 sec x 2 reps each side, Tandem stance x 10 sec x 2 each direction, Standing on blue foam beam for static standing, EO, EC, EO with head turns/nods.  Rocker board both directions.             PT Education - 06/12/19 1454    Education Details  Using treadmill safely and decreasing speed to focus on balance, checking BP before treadmill and after 10 minutes on treadmill    Person(s) Educated  Patient;Child(ren)    Methods  Explanation;Demonstration    Comprehension  Verbalized understanding          PT Long Term Goals - 06/01/19 1542      PT LONG TERM GOAL #1   Title  Pt will be independent with HEP in order to indicate decreased fall risk and improved functional mobility. (Target Date: 07/01/19)    Time  4    Period  Weeks    Status  New     Target Date  07/01/19      PT LONG TERM GOAL #2   Title  Pt will improve gait speed to 4.37 ft/sec without AD in order to indicate normal gait speed.      PT LONG TERM GOAL #3   Title  Pt will improve FGA to >/=23/30 in order to indicate decreased fall risk.      PT LONG TERM GOAL #4   Title  Pt will ambulate >1000' independently over varying indoor and outdoor surfaces in order to indicate safe return to community and leisure activity.            Plan - 06/12/19 1424    Clinical Impression Statement  Skilled session focused on balance activities and safety on treadmill.  Pt with increase in BP with treadmill today.  Pt motivated to increase mobiity and return to prior level.  Continue PT per POC.    Personal Factors and Comorbidities  Age;Comorbidity 1    Comorbidities  HTN    Examination-Activity Limitations  Stairs;Locomotion Level;Carry    Examination-Participation Restrictions  Driving;Shop;Other   exercise   Stability/Clinical Decision Making  Stable/Uncomplicated    Rehab Potential  Excellent    PT Frequency  2x / week    PT Duration  4 weeks    PT Treatment/Interventions  ADLs/Self Care Home Management;Gait training;Stair training;Functional mobility training;Therapeutic activities;Therapeutic exercise;Balance training;Neuromuscular re-education;Patient/family education    PT Next Visit Plan  HIGH level balance, transitions between varying surfaces, compliant sufaces, SLS and eyes closed. Ask how her BP was on treadmill at home. lots of fun to work with!    PT Home Exercise Plan  [id number]    Consulted and Agree with Plan of Care  Patient;Family member/caregiver    Family Member Consulted  daughter Harriett Sineancy       Patient will benefit from skilled therapeutic intervention in order to improve the following deficits and impairments:  Decreased activity tolerance, Decreased balance, Decreased endurance, Decreased safety awareness, Decreased strength  Visit  Diagnosis: Unsteadiness on feet  Other abnormalities of gait and mobility     Problem List Patient Active Problem List   Diagnosis Date Noted  . Acute CVA (cerebrovascular accident) (HCC) 05/22/2019  . Essential hypertension 05/22/2019  . Mild cognitive impairment with memory loss   . Radiculopathy   . Hypertension     Angelique Blonderenise  Gerhard Perches, PTA [facility] 06/12/19 2:56 PM Phone: [phone] Fax: [phone]   [facility] [address] [place], [place], [address] Phone: [phone]   Fax:  [phone]  Name: Vicki Cummings MRN: [id number] Date of Birth: [birth year]

## 2019-06-12 NOTE — ED Notes (Signed)
Patient verbalizes understanding of discharge instructions. Opportunity for questioning and answers were provided. Armband removed by staff, pt discharged from ED ambulatory.   

## 2019-06-16 ENCOUNTER — Ambulatory Visit: Payer: Medicare Other | Admitting: Physical Therapy

## 2019-06-16 ENCOUNTER — Encounter: Payer: Self-pay | Admitting: Physical Therapy

## 2019-06-16 ENCOUNTER — Ambulatory Visit: Payer: Medicare Other | Attending: Internal Medicine | Admitting: Occupational Therapy

## 2019-06-16 ENCOUNTER — Other Ambulatory Visit: Payer: Self-pay

## 2019-06-16 VITALS — BP 141/65 | HR 70

## 2019-06-16 DIAGNOSIS — M6281 Muscle weakness (generalized): Secondary | ICD-10-CM

## 2019-06-16 DIAGNOSIS — R2689 Other abnormalities of gait and mobility: Secondary | ICD-10-CM | POA: Diagnosis present

## 2019-06-16 DIAGNOSIS — R2681 Unsteadiness on feet: Secondary | ICD-10-CM | POA: Insufficient documentation

## 2019-06-16 DIAGNOSIS — R278 Other lack of coordination: Secondary | ICD-10-CM | POA: Insufficient documentation

## 2019-06-16 DIAGNOSIS — I69318 Other symptoms and signs involving cognitive functions following cerebral infarction: Secondary | ICD-10-CM | POA: Insufficient documentation

## 2019-06-16 NOTE — Patient Instructions (Signed)
Cognitive Tips  Keep a journal/notebook with sections for the following (or use sections separately as needed):  Calendar and appointment sheet, schedule for each day, lists of reminders (such as grocery lists or "to do" list), homework assignments for therapy, important information such as family and friends names / addresses / phone numbers, medications, medical history and doctors name / phone numbers.  Avoid / remove clutter and unnecessary items from areas such as countertops / cabinets in kitchen and bathroom, closets, etc.  Organize items by purpose.  Baskets and bins help with this.  Leave notes for reminders above task to be completed.  For example: Note to turn off stove over the the stove; note to lock door beside the door, not to brush teeth then wash face by sink, note to take medication on table etc.  To help recall names of people of people or things, mentally or verbally go through the alphabet to try to determine the 1st letter of the word as this may trigger the name or word you are looking for.  If this is too difficult and someone else knows the word, have them give you the first letter by asking, "does it start with an "A", "B", "C" etc. (or have them give you the first sound of the word or some other clue).  Review family events, occasions, names, etc.  Pictures are a good way to trigger memory.  Have others correct you if you answer something incorrectly.  Have them speak slowly with a few words to give you time to process and respond.  Don't let others automatically problem-solve. (For example: don't let them automatically lay out clothes in the correct position, but hand it to you folded so that you can figure it out for yourself.)  However, if you need help with tasks, they should give you as little as they can so that you can be successful.  If appropriate and safe, they may allow you to make mistakes so that you can figure out how to correct the error.  (For example, they  may allow you to put your shoes on the wrong foot to see if you notice that is wrong).  If you struggle, they should give you a cue.  (Example: "Do your shoes feel right?"  "Do they look right?")  Keeping Thinking Skills Sharp: 1. Jigsaw puzzles 2. Card/board games 3. Talking on the phone/social events 4. Lumosity.com 5. Online games 6. Word serches/crossword puzzles 7.  Logic puzzles 8. Aerobic exercise (stationary bike) 9. Eating balanced diet (fruits & veggies) 10. Drink water 11. Try something new--new recipe, hobby 12. Crafts 13. Do a variety of activities that are challenging 14. Add cognitive activities to walking/exercising (think of animal/food/city with each letter of the alphabet, counting backwards, thinking of as many vegetables as you can, etc.).--Only do this  If safe (no freezing/falls).

## 2019-06-16 NOTE — Therapy (Signed)
[facility] [address] [place], [place], [address] Phone: [phone]   Fax:  [phone]  Occupational Therapy Treatment  Patient Details  Name: Vicki Cummings MRN: [id number] Date of Birth: [birth year] Referring Provider (OT): Dr. Edrick Oh (MD in [place])    Encounter Date: 06/16/2019  OT End of Session - 06/16/19 1305    Visit Number  5    Number of Visits  17    Date for OT Re-Evaluation  07/28/19    Authorization Type  MCR, Generic commercial as secondary    Authorization - Visit Number  5    Authorization - Number of Visits  10    OT Start Time  1150    OT Stop Time  1235    OT Time Calculation (min)  45 min    Activity Tolerance  Patient tolerated treatment well    Behavior During Therapy  [facility] for tasks assessed/performed       Past Medical History:  Diagnosis Date  . H/O blood clots    "30-40 years ago"  . Hypertension   . Mild cognitive impairment with memory loss   . Radiculopathy   . TIA (transient ischemic attack)     No past surgical history on file.  There were no vitals filed for this visit.  Subjective Assessment - 06/16/19 1304    Subjective   Pt went to ED on 06/11/19 due to Rt visual field cut, however daughter reports it cleared and MRI did not show new infarct. 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 PMH: TIA's, HTN, radiculopathy    Patient Stated Goals  pt wants to return to driving and living [place]    Currently in Pain?  No/denies       Discussed recent visit to ED w/ visual changes, headache, and decreased balance - pt/daughter reports this has resolved and that MRI did not show any acute stroke.  Also discussed plans once pt returns to [place]. Pt's daughter said they were planning on moving pt into AL facility and that as a family they decided she will not pursue driving (family will drive pt  where/when needed) Reviewed memory compensatory strategies and cognitive tips.  Discussed plan going forward and possibly deferring some goals that will no longer be required.                     OT Education - 06/16/19 1303    Education Details  discussed ways to keep thinking skills sharp and cognitive tips    Person(s) Educated  Patient;Child(ren)    Methods  Explanation;Handout    Comprehension  Verbalized understanding       OT Short Term Goals - 06/08/19 1853      OT SHORT TERM GOAL #1   Title  Pt to be able to verbalize proper safety/problem solving techniques to hypothetical scenarios    Period  Weeks    Status  Achieved      OT SHORT TERM GOAL #2   Title  Pt able to verbalize memory compensatory strategies, adaptations and potential A/E and/or external aids for medication management    Time  4    Period  Weeks    Status  On-going      OT SHORT TERM GOAL #3   Title  Pt able to perform simple snack/sandwich/microwaveable items safely with supervision  Time  4    Period  Weeks    Status  Achieved      OT SHORT TERM GOAL #4   Title  Pt able to perform complete laundry task w/ supervision    Time  4    Period  Weeks    Status  On-going      OT SHORT TERM GOAL #5   Title  Pt independent with coordination HEP for Rt hand    Time  4    Period  Weeks    Status  On-going        OT Long Term Goals - 06/16/19 1307      OT LONG TERM GOAL #1   Title  Pt to be mod I for simple stovetop cooking    Time  8    Period  Weeks    Status  Partially Met   will need supervision to ensure safety     OT LONG TERM GOAL #2   Title  Pt to follow familiar recipe or simple recipe with sufficient memory and sequencing to perform mod I level    Time  8    Period  Weeks    Status  New      OT LONG TERM GOAL #3   Title  Pt to perform medication management I'ly (w/ external aids prn) after initial set up in pill box    Time  8    Period  Weeks    Status  New       OT LONG TERM GOAL #4   Title  Pt to be able to plan breakfast and lunch for week and corresponding grocery list w/ extra time prn    Time  8    Period  Weeks    Status  New      OT LONG TERM GOAL #5   Title  Pt able to perform basic money management/exchange with 90% accuracy    Time  8    Period  Weeks    Status  New      OT LONG TERM GOAL #6   Title  Pt to perform environmental scanning with 85% accuracy or greater    Time  8    Period  Weeks    Status  New            Plan - 06/16/19 1307    Clinical Impression Statement  Pt continues to demo ability to perform IADLS w/ supervision d/t cognitive deficits/memory    Occupational performance deficits (Please refer to evaluation for details):  IADL's;Leisure;Social Participation    Body Structure / Function / Physical Skills  Balance;ADL;Mobility;Strength;Coordination;Decreased knowledge of precautions;UE functional use    Cognitive Skills  Safety Awareness;Memory;Perception;Problem Solve;Sequencing;Understand    Rehab Potential  Good    OT Frequency  2x / week    OT Duration  8 weeks    OT Treatment/Interventions  Self-care/ADL training;Therapeutic exercise;Functional Mobility Training;Neuromuscular education;Therapeutic activities;Coping strategies training;DME and/or AE instruction;Cognitive remediation/compensation;Visual/perceptual remediation/compensation;Passive range of motion;Patient/family education    Plan  make meal plan and grocery list, make simple familiar meal    Consulted and Agree with Plan of Care  Patient;Family member/caregiver    Family Member Consulted  daughter       Patient will benefit from skilled therapeutic intervention in order to improve the following deficits and impairments:   Body Structure / Function / Physical Skills: Balance, ADL, Mobility, Strength, Coordination, Decreased knowledge of precautions, UE functional use Cognitive Skills: Safety  Awareness, Memory, Perception, Problem  Solve, Sequencing, Understand     Visit Diagnosis: Other symptoms and signs involving cognitive functions following cerebral infarction    Problem List Patient Active Problem List   Diagnosis Date Noted  . Acute CVA (cerebrovascular accident) ([place]) 05/22/2019  . Essential hypertension 05/22/2019  . Mild cognitive impairment with memory loss   . Radiculopathy   . Hypertension     Carey Bullocks, OTR/L 06/16/2019, 1:09 PM  [facility] [address] [place], [place], [address] Phone: [phone]   Fax:  [phone]  Name: Vicki Cummings MRN: [id number] Date of Birth: [birth year]

## 2019-06-16 NOTE — Therapy (Signed)
[facility] [address] [place], [place], [address] Phone: [phone]   Fax:  [phone]  Physical Therapy Treatment  Patient Details  Name: Vicki Cummings MRN: [id number] Date of Birth: Jan 06, 1926 Referring Provider (PT): Edrick Oh, MD   Encounter Date: 06/16/2019  PT End of Session - 06/16/19 1156    Visit Number  4    Number of Visits  9    Date for PT Re-Evaluation  07/31/19   only plan to see for 30 days   Authorization Type  Medicare and HOP supplement: 10th visit progress note    PT Start Time  1102    PT Stop Time  1145    PT Time Calculation (min)  43 min    Equipment Utilized During Treatment  Gait belt    Activity Tolerance  Patient tolerated treatment well    Behavior During Therapy  [facility] for tasks assessed/performed       Past Medical History:  Diagnosis Date  . H/O blood clots    "30-40 years ago"  . Hypertension   . Mild cognitive impairment with memory loss   . Radiculopathy   . TIA (transient ischemic attack)     History reviewed. No pertinent surgical history.  Vitals:   06/16/19 1105  BP: (!) 141/65  Pulse: 70    Subjective Assessment - 06/16/19 1103    Subjective  No falls. Has not tried using the treadmill yet at home. Has been doing her exercises at home. States the heel toe is pretty difficult.    Patient is accompained by:  Family member   [place]   Limitations  Walking    How long can you walk comfortably?  has not walked for exercise yet.    Patient Stated Goals  Pt would like to get better enough to move back to [place].    Currently in Pain?  No/denies                          Balance Exercises - 06/16/19 1200      Balance Exercises: Standing   Standing Eyes Closed  Narrow base of support (BOS);Wide (BOA);Foam/compliant surface   3 x 30 seconds wider BOS, 3 x 15 seconds narrow BOS   Tandem Stance  Eyes open;Foam/compliant surface;3 reps;20 secs    modified tandem stance, 3/4 - wider BOS       NMR in // bars: On blue foam beam:   -forward tandem walking down and back x3 reps with intermittent UE support -side stepping down and back x1 rep with no UE support  -grapevine stepping anteriorly with narrow BOS down and back 2 reps with min A/min guard.  On blue floor mat:  -forward stepping over smaller obstacle 1 x 5  reps B -forward walking over smaller obstacle then larger obstacle with cues for increased hip/knee flexion vs. circumduction x4 reps -With 3 cones placed in a semi-circle, cone taps bilaterally progressing to naming a color sequence for pt to tap progressing to pt naming out objects of the same color when touching colored cone. Pt taking increased time to perform and had tendency to name same objects    Standing rest breaks taken between exercises.   PT Education - 06/16/19 1156    Education Details  taking BP daily at home and after treadmill/recumbent bike    Person(s) Educated  Patient;Child(ren)    Methods  Explanation    Comprehension  Verbalized  understanding          PT Long Term Goals - 06/01/19 1542      PT LONG TERM GOAL #1   Title  Pt will be independent with HEP in order to indicate decreased fall risk and improved functional mobility. (Target Date: 07/01/19)    Time  4    Period  Weeks    Status  New    Target Date  07/01/19      PT LONG TERM GOAL #2   Title  Pt will improve gait speed to 4.37 ft/sec without AD in order to indicate normal gait speed.      PT LONG TERM GOAL #3   Title  Pt will improve FGA to >/=23/30 in order to indicate decreased fall risk.      PT LONG TERM GOAL #4   Title  Pt will ambulate >1000' independently over varying indoor and outdoor surfaces in order to indicate safe return to community and leisure activity.            Plan - 06/16/19 1158    Clinical Impression Statement  Skilled session focused on balance on compliant surfaces with narrow BOS, SLS and  eyes closed. Pt with increased difficulty on foam with eyes closed and a narrower BOS, indicating decreased vestibular input for balance. Pt needed min guard/min A for all balance activities today. Will continue to progress towards LTGs.    Personal Factors and Comorbidities  Age;Comorbidity 1    Comorbidities  HTN    Examination-Activity Limitations  Stairs;Locomotion Level;Carry    Examination-Participation Restrictions  Driving;Shop;Other   exercise   Stability/Clinical Decision Making  Stable/Uncomplicated    Rehab Potential  Excellent    PT Frequency  2x / week    PT Duration  4 weeks    PT Treatment/Interventions  ADLs/Self Care Home Management;Gait training;Stair training;Functional mobility training;Therapeutic activities;Therapeutic exercise;Balance training;Neuromuscular re-education;Patient/family education    PT Next Visit Plan  rockerboard/reaching outside of BOS. HIGH level balance, transitions between varying surfaces, compliant sufaces, SLS and eyes closed (esp exercises for vestibular system). Ask how her BP was on treadmill at home. lots of fun to work with!    PT Home Exercise Plan  [id number]    Consulted and Agree with Plan of Care  Patient;Family member/caregiver    Family Member Consulted  daughter Harriett Sine       Patient will benefit from skilled therapeutic intervention in order to improve the following deficits and impairments:  Decreased activity tolerance, Decreased balance, Decreased endurance, Decreased safety awareness, Decreased strength  Visit Diagnosis: Unsteadiness on feet  Other abnormalities of gait and mobility  Other lack of coordination  Muscle weakness (generalized)     Problem List Patient Active Problem List   Diagnosis Date Noted  . Acute CVA (cerebrovascular accident) ([facility]) 05/22/2019  . Essential hypertension 05/22/2019  . Mild cognitive impairment with memory loss   . Radiculopathy   . Hypertension     Drake Leach, PT, DPT   06/16/2019, 12:04 PM  [facility] [address] [place], [place], [address] Phone: [phone]   Fax:  [phone]  Name: Karcyn Menn MRN: [id number] Date of Birth: 1926-01-02

## 2019-06-18 ENCOUNTER — Other Ambulatory Visit: Payer: Self-pay

## 2019-06-18 ENCOUNTER — Ambulatory Visit: Payer: Medicare Other | Admitting: Occupational Therapy

## 2019-06-18 DIAGNOSIS — I69318 Other symptoms and signs involving cognitive functions following cerebral infarction: Secondary | ICD-10-CM | POA: Diagnosis not present

## 2019-06-18 NOTE — Therapy (Signed)
[facility] [address] [place], [place], [address] Phone: [phone]   Fax:  [phone]  Occupational Therapy Treatment  Patient Details  Name: Vicki Cummings MRN: [id number] Date of Birth: Oct 02, 1925 Referring Provider (OT): Dr. Edrick Oh (MD in [place])    Encounter Date: 06/18/2019  OT End of Session - 06/18/19 1436    Visit Number  6    Number of Visits  17    Date for OT Re-Evaluation  07/28/19    Authorization Type  MCR, Generic commercial as secondary    Authorization - Visit Number  6    Authorization - Number of Visits  10    OT Start Time  1230    OT Stop Time  1315    OT Time Calculation (min)  45 min    Activity Tolerance  Patient tolerated treatment well    Behavior During Therapy  [facility] for tasks assessed/performed       Past Medical History:  Diagnosis Date  . H/O blood clots    "30-40 years ago"  . Hypertension   . Mild cognitive impairment with memory loss   . Radiculopathy   . TIA (transient ischemic attack)     No past surgical history on file.  There were no vitals filed for this visit.  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 PMH: TIA's, HTN, radiculopathy    Patient Stated Goals  pt wants to return to driving and living [place]    Currently in Pain?  No/denies       Pt making breakfast/lunch menu followed by corresponding grocery list with min to mod questioning prompts to be thorough in grocery list. Pt making familiar stovetop task (scrambled eggs) gathering ingredients one at a time (decreased planning - getting 1 item at a time from refrigerator vs. Getting all items needed at once). Pt however performed task well w/ only supervision. Pt noted to not close one cabinet and lightly hit head due to decreased attention.                       OT Short Term Goals - 06/08/19  1853      OT SHORT TERM GOAL #1   Title  Pt to be able to verbalize proper safety/problem solving techniques to hypothetical scenarios    Period  Weeks    Status  Achieved      OT SHORT TERM GOAL #2   Title  Pt able to verbalize memory compensatory strategies, adaptations and potential A/E and/or external aids for medication management    Time  4    Period  Weeks    Status  On-going      OT SHORT TERM GOAL #3   Title  Pt able to perform simple snack/sandwich/microwaveable items safely with supervision    Time  4    Period  Weeks    Status  Achieved      OT SHORT TERM GOAL #4   Title  Pt able to perform complete laundry task w/ supervision    Time  4    Period  Weeks    Status  On-going      OT SHORT TERM GOAL #5   Title  Pt independent with coordination HEP for Rt hand    Time  4    Period  Weeks  Status  On-going        OT Long Term Goals - 06/16/19 1307      OT LONG TERM GOAL #1   Title  Pt to be mod I for simple stovetop cooking    Time  8    Period  Weeks    Status  Partially Met   will need supervision to ensure safety     OT LONG TERM GOAL #2   Title  Pt to follow familiar recipe or simple recipe with sufficient memory and sequencing to perform mod I level    Time  8    Period  Weeks    Status  New      OT LONG TERM GOAL #3   Title  Pt to perform medication management I'ly (w/ external aids prn) after initial set up in pill box    Time  8    Period  Weeks    Status  New      OT LONG TERM GOAL #4   Title  Pt to be able to plan breakfast and lunch for week and corresponding grocery list w/ extra time prn    Time  8    Period  Weeks    Status  New      OT LONG TERM GOAL #5   Title  Pt able to perform basic money management/exchange with 90% accuracy    Time  8    Period  Weeks    Status  New      OT LONG TERM GOAL #6   Title  Pt to perform environmental scanning with 85% accuracy or greater    Time  8    Period  Weeks    Status  New             Plan - 06/18/19 1436    Clinical Impression Statement  Pt demo ability to perform familiar stovetop meal w/ just supervision. Pt approximating goals    Occupational performance deficits (Please refer to evaluation for details):  IADL's;Leisure;Social Participation    Body Structure / Function / Physical Skills  Balance;ADL;Mobility;Strength;Coordination;Decreased knowledge of precautions;UE functional use    Cognitive Skills  Safety Awareness;Memory;Perception;Problem Solve;Sequencing;Understand    OT Frequency  2x / week    OT Duration  8 weeks    OT Treatment/Interventions  Self-care/ADL training;Therapeutic exercise;Functional Mobility Training;Neuromuscular education;Therapeutic activities;Coping strategies training;DME and/or AE instruction;Cognitive remediation/compensation;Visual/perceptual remediation/compensation;Passive range of motion;Patient/family education    Plan  check goals, assess laundry task, planning/organization task    Consulted and Agree with Plan of Care  Patient;Family member/caregiver    Family Member Consulted  daughter       Patient will benefit from skilled therapeutic intervention in order to improve the following deficits and impairments:   Body Structure / Function / Physical Skills: Balance, ADL, Mobility, Strength, Coordination, Decreased knowledge of precautions, UE functional use Cognitive Skills: Safety Awareness, Memory, Perception, Problem Solve, Sequencing, Understand     Visit Diagnosis: Other symptoms and signs involving cognitive functions following cerebral infarction    Problem List Patient Active Problem List   Diagnosis Date Noted  . Acute CVA (cerebrovascular accident) ([place]) 05/22/2019  . Essential hypertension 05/22/2019  . Mild cognitive impairment with memory loss   . Radiculopathy   . Hypertension     Carey Bullocks, OTR/L 06/18/2019, 2:38 PM  [facility] [address] [place], [place], [address] Phone: [phone]   Fax:  [phone]  Name:  Tanara Turvey MRN: [id number] Date of Birth: 05/24/1926

## 2019-06-23 ENCOUNTER — Encounter: Payer: Self-pay | Admitting: Physical Therapy

## 2019-06-23 ENCOUNTER — Ambulatory Visit: Payer: Medicare Other | Admitting: Occupational Therapy

## 2019-06-23 ENCOUNTER — Other Ambulatory Visit: Payer: Self-pay

## 2019-06-23 ENCOUNTER — Ambulatory Visit: Payer: Medicare Other | Admitting: Physical Therapy

## 2019-06-23 DIAGNOSIS — R2689 Other abnormalities of gait and mobility: Secondary | ICD-10-CM

## 2019-06-23 DIAGNOSIS — I69318 Other symptoms and signs involving cognitive functions following cerebral infarction: Secondary | ICD-10-CM

## 2019-06-23 DIAGNOSIS — R2681 Unsteadiness on feet: Secondary | ICD-10-CM

## 2019-06-23 NOTE — Therapy (Signed)
[facility] [address] [place], [place], [address] Phone: [phone]   Fax:  [phone]  Occupational Therapy Treatment  Patient Details  Name: Vicki Cummings MRN: [id number] Date of Birth: [birth year] Referring Provider (OT): Dr. Edrick Oh (MD in [place])    Encounter Date: 06/23/2019  OT End of Session - 06/23/19 1304    Visit Number  7    Number of Visits  17    Date for OT Re-Evaluation  07/28/19    Authorization Type  MCR, Generic commercial as secondary    Authorization - Visit Number  7    Authorization - Number of Visits  10    OT Start Time  6144    OT Stop Time  1230    OT Time Calculation (min)  45 min    Activity Tolerance  Patient tolerated treatment well    Behavior During Therapy  [facility] for tasks assessed/performed       Past Medical History:  Diagnosis Date  . H/O blood clots    "30-40 years ago"  . Hypertension   . Mild cognitive impairment with memory loss   . Radiculopathy   . TIA (transient ischemic attack)     No past surgical history on file.  There were no vitals filed for this visit.  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 PMH: TIA's, HTN, radiculopathy    Patient Stated Goals  pt wants to return to driving and living [place]    Currently in Pain?  No/denies       Assessed laundry task performed at daughter's home - pt needed cueing d/t unfamiliar w/ dtr's washing machine and dryer, but could do task otherwise.  Assessed remaining goals however deferred multiple LTG's secondary to no longer needed - pt will need supervision for certain tasks which will be provided by family until pt moves into AL facility (at which time, they will provide these services)  Pt performed simple organization task requiring sequencing of 4 errands and problem solving -  pt placed tasks in correct order but initially omitted one task and required cues to go back and find missed task.  Pt performing planning tasks: ordering flowers w/ details needed - pt able to list information needed w/o cueing. Progressed to creating packing list for hypothetical vacation - pt required mod cueing to list everything needed.                       OT Short Term Goals - 06/23/19 1305      OT SHORT TERM GOAL #1   Title  Pt to be able to verbalize proper safety/problem solving techniques to hypothetical scenarios    Period  Weeks    Status  Achieved      OT SHORT TERM GOAL #2   Title  Pt able to verbalize memory compensatory strategies, adaptations and potential A/E and/or external aids for medication management    Time  4    Period  Weeks    Status  Achieved      OT SHORT TERM GOAL #3   Title  Pt able to perform simple snack/sandwich/microwaveable items safely with supervision    Time  4    Period  Weeks    Status  Achieved  OT SHORT TERM GOAL #4   Title  Pt able to perform complete laundry task w/ supervision    Time  4    Period  Weeks    Status  Achieved   needs cues for using unfamiliar machine     OT SHORT TERM GOAL #5   Title  Pt independent with coordination HEP for Rt hand    Time  4    Period  Weeks    Status  Achieved        OT Long Term Goals - 06/23/19 1306      OT LONG TERM GOAL #1   Title  Pt to be mod I for simple stovetop cooking    Time  8    Period  Weeks    Status  Partially Met   will need supervision to ensure safety     OT LONG TERM GOAL #2   Title  Pt to follow familiar recipe or simple recipe with sufficient memory and sequencing to perform mod I level    Time  8    Period  Weeks    Status  Partially Met   with supervision/cues to ensure safety     OT LONG TERM GOAL #3   Title  Pt to perform medication management I'ly (w/ external aids prn) after initial set up in pill box    Time  8    Period   Weeks    Status  Deferred   family to provide supervision and plans on going into AL facility which will also provide medication management     OT LONG TERM GOAL #4   Title  Pt to be able to plan breakfast and lunch for week and corresponding grocery list w/ extra time prn    Time  8    Period  Weeks    Status  Partially Met   needs cueing for thoroughness     OT LONG TERM GOAL #5   Title  Pt able to perform basic money management/exchange with 90% accuracy    Time  8    Period  Weeks    Status  Deferred   family reports no longer needing to perform     OT LONG TERM GOAL #6   Title  Pt to perform environmental scanning with 85% accuracy or greater    Time  8    Period  Weeks    Status  Deferred   Pt will not return to driving           Plan - 06/23/19 1308    Clinical Impression Statement  Pt has met all STG's. LTG's are partially met or deferred due to no longer needed - pt will remain in supervision of family until pt goes into AL facility which then will provide meals, medication management, and laundering of clothes.    Occupational performance deficits (Please refer to evaluation for details):  IADL's;Leisure;Social Participation    Body Structure / Function / Physical Skills  Balance;ADL;Mobility;Strength;Coordination;Decreased knowledge of precautions;UE functional use    Cognitive Skills  Safety Awareness;Memory;Perception;Problem Solve;Sequencing;Understand    OT Frequency  2x / week    OT Duration  8 weeks    OT Treatment/Interventions  Self-care/ADL training;Therapeutic exercise;Functional Mobility Training;Neuromuscular education;Therapeutic activities;Coping strategies training;DME and/or AE instruction;Cognitive remediation/compensation;Visual/perceptual remediation/compensation;Passive range of motion;Patient/family education    Plan  D/C O.Donnajean Lopes and Agree with Plan of Care  Patient;Family member/caregiver    Family Member Consulted  daughter  Patient will benefit from skilled therapeutic intervention in order to improve the following deficits and impairments:   Body Structure / Function / Physical Skills: Balance, ADL, Mobility, Strength, Coordination, Decreased knowledge of precautions, UE functional use Cognitive Skills: Safety Awareness, Memory, Perception, Problem Solve, Sequencing, Understand     Visit Diagnosis: Other symptoms and signs involving cognitive functions following cerebral infarction    Problem List Patient Active Problem List   Diagnosis Date Noted  . Acute CVA (cerebrovascular accident) ([place]) 05/22/2019  . Essential hypertension 05/22/2019  . Mild cognitive impairment with memory loss   . Radiculopathy   . Hypertension     OCCUPATIONAL THERAPY DISCHARGE SUMMARY  Visits from Start of Care: 7   Current functional level related to goals / functional outcomes: See above    Remaining deficits: Cognition including memory and executive functioning   Education / Equipment: HEP, memory strategies   Plan: Patient agrees to discharge.  Patient goals were partially met. Patient is being discharged due to being pleased with the current functional level.  ?????         Carey Bullocks, OTR/L 06/23/2019, 1:10 PM  [facility] [address] [place], [place], [address] Phone: [phone]   Fax:  [phone]  Name: Vicki Cummings MRN: [id number] Date of Birth: 20-Aug-1925

## 2019-06-23 NOTE — Therapy (Addendum)
[facility] [address] [place], [place], [address] Phone: [phone]   Fax:  [phone]  Physical Therapy Treatment  Patient Details  Name: Vicki Cummings MRN: [id number] Date of Birth: November 23, 1925 Referring Provider (PT): Vashti Hey, MD   Encounter Date: 06/23/2019  PT End of Session - 06/23/19 1206    Visit Number  5    Number of Visits  9    Date for PT Re-Evaluation  07/31/19   only plan to see for 30 days   Authorization Type  Medicare and HOP supplement: 10th visit progress note    PT Start Time  1102    PT Stop Time  1146    PT Time Calculation (min)  44 min    Equipment Utilized During Treatment  Gait belt    Activity Tolerance  Patient tolerated treatment well    Behavior During Therapy  [facility] for tasks assessed/performed       Past Medical History:  Diagnosis Date  . H/O blood clots    "30-40 years ago"  . Hypertension   . Mild cognitive impairment with memory loss   . Radiculopathy   . TIA (transient ischemic attack)     History reviewed. No pertinent surgical history.  There were no vitals filed for this visit.  Subjective Assessment - 06/23/19 1105    Subjective  No falls. BP has been running good at home. Walks about 30 minutes outside at the park with her daughter. Is moving into an assisted living facility at the beginning of January in [place].    Patient is accompained by:  Family member   [place]   Limitations  Walking    How long can you walk comfortably?  has not walked for exercise yet.    Patient Stated Goals  Pt would like to get better enough to move back to [place].    Currently in Pain?  No/denies                       [facility] Adult PT Treatment/Exercise - 06/23/19 1207      Ambulation/Gait   Ambulation/Gait  Yes    Ambulation/Gait Assistance  5: Supervision;4: Min guard    Ambulation/Gait Assistance Details  Ambulated outdoors on grass with asking pt to scan  environment - look right/left and up/down, with slight veering but no LOB. min guard for safety. Ambulated in clinic with ball toss and cognitive challenge/dual tasking - pt stating where she has traveled in the past, cues to attend to task of tossing ball, no LOB, pt with slower gait speed than her normal speed    Ambulation Distance (Feet)  600 Feet    Assistive device  None    Gait Pattern  Step-through pattern;Decreased stride length;Wide base of support    Ambulation Surface  Unlevel;Outdoor;Level;Indoor;Paved;Grass    Curb  6: Modified independent (Device/increase time)      Neuro Re-ed    Neuro Re-ed Details   On rockerboard: static balance holds 2 x 10 reps head turns, 2 x 10 reps head nods - with min guard/min A for balance in // bars. Forward alternating stepping over 6 hurdles of 2 different heights - cues for increased hip/knee flexion to clear, down and back 2 reps. Lateral stepping over 6 hurdles down and back 2 reps, pt with increased difficulty shifting to RLE to step LLE over hurdle. Min A/min guard for safety.  Balance Exercises - 06/23/19 1210      Balance Exercises: Standing   Standing Eyes Closed  Narrow base of support (BOS);Foam/compliant surface;3 reps;30 secs   feet together   Tandem Gait  Forward;Upper extremity support;Foam/compliant surface   down and back at countertop 2 reps            PT Long Term Goals - 06/01/19 1542      PT LONG TERM GOAL #1   Title  Pt will be independent with HEP in order to indicate decreased fall risk and improved functional mobility. (Target Date: 07/01/19)    Time  4    Period  Weeks    Status  New    Target Date  07/01/19      PT LONG TERM GOAL #2   Title  Pt will improve gait speed to 4.37 ft/sec without AD in order to indicate normal gait speed.      PT LONG TERM GOAL #3   Title  Pt will improve FGA to >/=23/30 in order to indicate decreased fall risk.      PT LONG TERM GOAL #4   Title  Pt will  ambulate >1000' independently over varying indoor and outdoor surfaces in order to indicate safe return to community and leisure activity.            06/23/19 1212  Plan  Clinical Impression Statement Focus of today's skilled session focused on gait training with dual tasking/scanning environment, SLS, and balance on compliant surfaces. Pt with improved ability today to perform feet together eyes closed on foam for 30 seconds - previously could not perform with feet together. When asking pt to scan environment outdoors on grass, pt had minimal veering but no LOB. Needed min guard/min A for all balance activites today esp stepping over hurdles and tandem walking on blue floor mat. Pt is progressing well - will continue to progress towards LTGs.  Personal Factors and Comorbidities Age;Comorbidity 1  Comorbidities HTN  Examination-Activity Limitations Stairs;Locomotion Level;Carry  Examination-Participation Restrictions Driving;Shop;Other  Pt will benefit from skilled therapeutic intervention in order to improve on the following deficits Decreased activity tolerance;Decreased balance;Decreased endurance;Decreased safety awareness;Decreased strength  Stability/Clinical Decision Making Stable/Uncomplicated  Clinical Decision Making Low  Rehab Potential Excellent  PT Frequency 2x / week  PT Duration 4 weeks  PT Treatment/Interventions ADLs/Self Care Home Management;Gait training;Stair training;Functional mobility training;Therapeutic activities;Therapeutic exercise;Balance training;Neuromuscular re-education;Patient/family education  PT Next Visit Plan continue tandem on blue floor mat/other standing balance on mats. rockerboard/reaching outside of BOS. HIGH level balance, transitions between varying surfaces, compliant sufaces, SLS and eyes closed (esp exercises for vestibular system). Ask how her BP was on treadmill at home.  PT Home Exercise Plan [email or web address]  Consulted and Agree with Plan of Care  Patient;Family member/caregiver  Family Member Consulted daughter Harriett Sineancy       Patient will benefit from skilled therapeutic intervention in order to improve the following deficits and impairments:     Visit Diagnosis: Unsteadiness on feet  Other symptoms and signs involving cognitive functions following cerebral infarction  Other abnormalities of gait and mobility     Problem List Patient Active Problem List   Diagnosis Date Noted  . Acute CVA (cerebrovascular accident) ([facility]) 05/22/2019  . Essential hypertension 05/22/2019  . Mild cognitive impairment with memory loss   . Radiculopathy   . Hypertension     Drake Leachhloe N Elicia Lui, PT, DPT  06/23/2019, 12:12 PM  [facility] [address]  [place], [place], [address] Phone: [phone]   Fax:  [phone]  Name: Vicki Cummings MRN: [id number] Date of Birth: 1925/09/25

## 2019-06-25 ENCOUNTER — Ambulatory Visit: Payer: Medicare Other | Admitting: Occupational Therapy

## 2019-06-25 ENCOUNTER — Ambulatory Visit: Payer: Medicare Other | Admitting: Physical Therapy

## 2019-06-30 ENCOUNTER — Ambulatory Visit: Payer: Medicare Other | Admitting: Physical Therapy

## 2019-06-30 ENCOUNTER — Encounter: Payer: Self-pay | Admitting: Physical Therapy

## 2019-06-30 ENCOUNTER — Encounter: Payer: PRIVATE HEALTH INSURANCE | Admitting: Occupational Therapy

## 2019-06-30 ENCOUNTER — Other Ambulatory Visit: Payer: Self-pay

## 2019-06-30 DIAGNOSIS — R2681 Unsteadiness on feet: Secondary | ICD-10-CM

## 2019-06-30 DIAGNOSIS — R2689 Other abnormalities of gait and mobility: Secondary | ICD-10-CM

## 2019-06-30 DIAGNOSIS — M6281 Muscle weakness (generalized): Secondary | ICD-10-CM

## 2019-06-30 DIAGNOSIS — I69318 Other symptoms and signs involving cognitive functions following cerebral infarction: Secondary | ICD-10-CM | POA: Diagnosis not present

## 2019-06-30 DIAGNOSIS — R278 Other lack of coordination: Secondary | ICD-10-CM

## 2019-06-30 NOTE — Therapy (Signed)
[facility] [address] [place], [place], [address] Phone: [phone]   Fax:  [phone]  Physical Therapy Treatment  Patient Details  Name: Vicki Cummings MRN: [id number] Date of Birth: 1926/02/22 Referring Provider (PT): Edrick Oh, MD   Encounter Date: 06/30/2019  PT End of Session - 06/30/19 1206    Visit Number  6    Number of Visits  9    Date for PT Re-Evaluation  07/31/19   only plan to see for 30 days   Authorization Type  Medicare and HOP supplement: 10th visit progress note    PT Start Time  1100    PT Stop Time  1145    PT Time Calculation (min)  45 min    Equipment Utilized During Treatment  Gait belt    Activity Tolerance  Patient tolerated treatment well    Behavior During Therapy  [facility] for tasks assessed/performed       Past Medical History:  Diagnosis Date  . H/O blood clots    "30-40 years ago"  . Hypertension   . Mild cognitive impairment with memory loss   . Radiculopathy   . TIA (transient ischemic attack)     History reviewed. No pertinent surgical history.  There were no vitals filed for this visit.  Subjective Assessment - 06/30/19 1102    Subjective  Missed last weeks appointment due to not feeling well - but is feeling back to normal now. No falls. Thinks her balance is better.    Patient is accompained by:  Family member   [place]   Limitations  Walking    How long can you walk comfortably?  has not walked for exercise yet.    Patient Stated Goals  Pt would like to get better enough to move back to [place].    Currently in Pain?  No/denies         [facility] PT Assessment - 06/30/19 1113      Ambulation/Gait   Gait velocity  7.69 seconds = 4.26 ft/sec      Functional Gait  Assessment   Gait assessed   Yes    Gait Level Surface  Walks 20 ft in less than 5.5 sec, no assistive devices, good speed, no evidence for imbalance, normal gait pattern, deviates no more than 6 in outside of  the 12 in walkway width.    Change in Gait Speed  Able to smoothly change walking speed without loss of balance or gait deviation. Deviate no more than 6 in outside of the 12 in walkway width.    Gait with Horizontal Head Turns  Performs head turns smoothly with slight change in gait velocity (eg, minor disruption to smooth gait path), deviates 6-10 in outside 12 in walkway width, or uses an assistive device.    Gait with Vertical Head Turns  Performs head turns with no change in gait. Deviates no more than 6 in outside 12 in walkway width.    Gait and Pivot Turn  Pivot turns safely within 3 sec and stops quickly with no loss of balance.    Step Over Obstacle  Is able to step over 2 stacked shoe boxes taped together (9 in total height) without changing gait speed. No evidence of imbalance.    Gait with Narrow Base of Support  Ambulates 4-7 steps.    Gait with Eyes Closed  Walks 20 ft, uses assistive device, slower speed, mild gait deviations, deviates 6-10 in outside 12 in walkway  width. Ambulates 20 ft in less than 9 sec but greater than 7 sec.   8.73 seconds   Ambulating Backwards  Walks 20 ft, uses assistive device, slower speed, mild gait deviations, deviates 6-10 in outside 12 in walkway width.    Steps  Alternating feet, no rail.    Total Score  25    FGA comment:  25/30                   [facility] Adult PT Treatment/Exercise - 06/30/19 1113      Ambulation/Gait   Ambulation/Gait  Yes    Ambulation/Gait Assistance  6: Modified independent (Device/Increase time)    Ambulation/Gait Assistance Details  Outdoors on unlevel grass and paved surfaces - pt able to maintain gait speed and converse with therapist with no LOB.    Ambulation Distance (Feet)  1000 Feet    Assistive device  None    Gait Pattern  Step-through pattern    Ambulation Surface  Unlevel;Outdoor;Paved;Grass    Stairs  Yes    Stairs Assistance  6: Modified independent (Device/Increase time)    Stair Management  Technique  Alternating pattern;No rails;One rail Right   no rails ascending, single rail descending   Number of Stairs  4    Height of Stairs  6          Access Code: [email or web address]  URL: [email or web address]  Date: 06/30/2019  Prepared by: Janann August   Exercises Heel Walking - 3 sets - 2x daily - 7x weekly Toe Walking - 3 sets - 2x daily - 7x weekly Tandem Walking with Counter Support - 3 sets - 2x daily - 7x weekly Standing Single Leg Stance with Counter Support - 3 sets - 10 hold - 2x daily - 7x weekly Tandem Stance - 3 sets - 15 hold - 2x daily - 7x weekly  New additions to HEP on 2 pillows in corner: pt had not been performing these at home.  Romberg Stance Eyes Closed on Foam Pad - 10 reps - 2 sets - 2x daily - 7x weekly - with 10 reps head nods up and down, 10 reps head turns side to side.  Romberg Stance Eyes Closed on Foam Pad - 3 sets - 30 hold - 1x daily - 7x weekly    PT Education - 06/30/19 1221    Education Details  progress towards LTGs, importance of performing corner balance exercises for HEP.    Person(s) Educated  Patient;Child(ren)    Methods  Explanation;Handout    Comprehension  Verbalized understanding;Returned demonstration          PT Long Term Goals - 06/30/19 1103      PT LONG TERM GOAL #1   Title  Pt will be independent with HEP in order to indicate decreased fall risk and improved functional mobility. (Target Date: 07/01/19)    Baseline  has not been performing corner balance exercises at home - will finalize HEP at next session.    Time  4    Period  Weeks    Status  On-going      PT LONG TERM GOAL #2   Title  Pt will improve gait speed to 4.37 ft/sec without AD in order to indicate normal gait speed.    Baseline  7.69 seconds = 4.26 ft/sec    Status  Not Met      PT LONG TERM GOAL #3   Title  Pt will improve FGA to >/=23/30 in order to indicate  decreased fall risk.    Baseline  25/30 on 06/30/19    Status  Achieved       PT LONG TERM GOAL #4   Title  Pt will ambulate >1000' independently over varying indoor and outdoor surfaces in order to indicate safe return to community and leisure activity.    Baseline  achieved on 06/30/19 - mod I ambulating outdoors on paved and grass surfaces.    Status  Achieved            Plan - 06/30/19 1220    Clinical Impression Statement  Focus of today's session was assessing pt's LTGs. Pt has met LTG #3 in regards to FGA score and LTG #4 in regards to gait outdoors over unlevel surfaces.  Pt has improved her gait speed with no AD to 4.26 ft/sec (previously 4.02 ft/sec), but pt had not met her goal level of 4.37 ft/sec for LTG #2. Pt has not yet met LTG #1 - has not been performing corner balance exercises for her HEP. Educated pt and pt's daughter on importance of performing these exercises for home to target vestibular system for balance. Reviewed/upgraded at end of session to add 2 floor pillows. Due to pt's progress towards LTGs, only anticipate needing one more visit to finalize HEP for D/C.  Pt and pt's daughter are pleased with pt's progress and are in agreement.    Personal Factors and Comorbidities  Age;Comorbidity 1    Comorbidities  HTN    Examination-Activity Limitations  Stairs;Locomotion Level;Carry    Examination-Participation Restrictions  Driving;Shop;Other    Stability/Clinical Decision Making  Stable/Uncomplicated    Rehab Potential  Excellent    PT Frequency  2x / week    PT Duration  4 weeks    PT Treatment/Interventions  ADLs/Self Care Home Management;Gait training;Stair training;Functional mobility training;Therapeutic activities;Therapeutic exercise;Balance training;Neuromuscular re-education;Patient/family education    PT Next Visit Plan  finalize HEP. anticipate D/C.    PT Home Exercise Plan  [email or web address]    Consulted and Agree with Plan of Care  Patient;Family member/caregiver    Family Member Consulted  daughter Izora Gala       Patient will  benefit from skilled therapeutic intervention in order to improve the following deficits and impairments:  Decreased activity tolerance, Decreased balance, Decreased endurance, Decreased safety awareness, Decreased strength  Visit Diagnosis: Unsteadiness on feet  Other abnormalities of gait and mobility  Muscle weakness (generalized)  Other lack of coordination     Problem List Patient Active Problem List   Diagnosis Date Noted  . Acute CVA (cerebrovascular accident) ([place]) 05/22/2019  . Essential hypertension 05/22/2019  . Mild cognitive impairment with memory loss   . Radiculopathy   . Hypertension     Arliss Journey, PT, DPT  06/30/2019, 12:21 PM  [facility] [address] [place], [place], [address] Phone: [phone]   Fax:  [phone]  Name: Avereigh Spainhower MRN: [id number] Date of Birth: 1926/02/27

## 2019-07-02 ENCOUNTER — Encounter: Payer: PRIVATE HEALTH INSURANCE | Admitting: Occupational Therapy

## 2019-07-02 ENCOUNTER — Ambulatory Visit: Payer: Medicare Other | Admitting: Physical Therapy

## 2019-07-07 ENCOUNTER — Encounter: Payer: PRIVATE HEALTH INSURANCE | Admitting: Occupational Therapy

## 2019-07-07 ENCOUNTER — Ambulatory Visit: Payer: Medicare Other | Admitting: Physical Therapy

## 2019-07-07 ENCOUNTER — Ambulatory Visit (INDEPENDENT_AMBULATORY_CARE_PROVIDER_SITE_OTHER): Payer: Medicare Other | Admitting: Adult Health

## 2019-07-07 ENCOUNTER — Other Ambulatory Visit: Payer: Self-pay

## 2019-07-07 ENCOUNTER — Encounter: Payer: Self-pay | Admitting: Adult Health

## 2019-07-07 VITALS — BP 124/66 | HR 68 | Temp 97.4°F | Ht 61.0 in | Wt 120.0 lb

## 2019-07-07 DIAGNOSIS — E785 Hyperlipidemia, unspecified: Secondary | ICD-10-CM

## 2019-07-07 DIAGNOSIS — G459 Transient cerebral ischemic attack, unspecified: Secondary | ICD-10-CM | POA: Diagnosis not present

## 2019-07-07 DIAGNOSIS — I6523 Occlusion and stenosis of bilateral carotid arteries: Secondary | ICD-10-CM

## 2019-07-07 DIAGNOSIS — I6350 Cerebral infarction due to unspecified occlusion or stenosis of unspecified cerebral artery: Secondary | ICD-10-CM

## 2019-07-07 DIAGNOSIS — I10 Essential (primary) hypertension: Secondary | ICD-10-CM

## 2019-07-07 DIAGNOSIS — I639 Cerebral infarction, unspecified: Secondary | ICD-10-CM

## 2019-07-07 NOTE — Progress Notes (Signed)
[facility] [address] [place]. [place] [address] [phone]       HOSPITAL FOLLOW UP NOTE  Ms. Vicki Cummings Date of Birth:  12/07/1925 Medical Record Number:  [id number]   Reason for Referral:  hospital stroke follow up    CHIEF COMPLAINT:  Chief Complaint  Patient presents with   Hospitalization Follow-up    stroke; she was in the hospital on 10/8 & 10/29; also has a history of strokes and was treated at [facility] in [place]. she denies any current symptoms. the pt states she didn't know she had the strokes.   Rm 9    here with daughter     HPI: Vicki Cummings being seen today for in office hospital follow-up regarding left paramedian pontine infarct secondary to small vessel disease on 05/21/2019.  History obtained from patient, daughter and chart review. Reviewed all radiology images and labs personally.  Ms. Vicki Cummings is a 85 y.o. female with history of TIA and blot clots 30-40 yrs ago  who presented on 05/21/2019 with dizziness, ataxia and slurred speech.  Stroke work-up showed left paramedian pontine infarct as evidenced on MRI secondary to small vessel disease.  In addition to acute infarct, MRI also showed evidence of moderate small vessel disease.  MRA negative for LVO with intracranial stenosis including right P2 segmental high-grade stenosis.  Carotid Doppler unremarkable.  2D echo normal EF.  Recommended DAPT for 3 weeks and Plavix alone as previously on aspirin.  HTN stable.  LDL 151 initiated atorvastatin 40 mg daily.  No evidence or history of DM with A1c 5.6.  Other stroke risk factors include advanced age and history of TIA.  Other active problems include baseline cognitive deficits and sinus bradycardia.  Ms. Vicki Cummings is a 85 year old female who is being seen today for hospital follow-up accompanied by her daughter.  She had additional admission on 06/11/2019 due to decreased right peripheral vision lasting approximately 15 to 30 minutes, and then  experience severe headache and increased fatigue.  She has experienced peripheral loss in 2015 with diagnosis of TIA per daughter report.  CT head unremarkable.  Repeat CTA no report of intracranial stenosis but prior MRA did show evidence of right P2 segmental high-grade stenosis, moderate left inferior division M2 narrowing and high-grade left superior PCA branch narrowing.  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  It was also recommended to have a 30-day cardiac event monitor to rule out atrial fibrillation.  MRI did not show evidence of acute stroke.  He was also recommended to increase atorvastatin to 80 mg daily.  Discharged home in stable condition.  She has been doing well since discharge.  She has not had any additional stroke/TIA symptoms.  She has had great improvement of her balance with ambulation back to baseline.  She has not had any additional headaches and denies any headache or migraine history.  She continued aspirin and Plavix for 1 week after discharge and then discontinued aspirin due to fear of a brain bleed.  She continued on Plavix alone.  She does endorse slightly increased bruising but denies any bleeding.  She has continued on atorvastatin 80 mg daily without myalgias.  Blood pressure today satisfactory 124/66.  Underlying history of MCI with slight worsening per daughter.  She resides in [place] and was visiting her daughter when her stroke occurred.  She plans on returning back to [place] after Christmas and  will be residing in a continuing care community.  She does have established PCP who has been updated on recent admissions.  She has not underwent 30-day CardioNet monitor at this time as it was not ordered at discharge.  No further concerns at this time.    ROS:   14 system review of systems performed and negative with exception of memory loss  PMH:  Past Medical History:    Diagnosis Date   H/O blood clots    "30-40 years ago"   Hypertension    Mild cognitive impairment with memory loss    Radiculopathy    TIA (transient ischemic attack)     PSH:  Past Surgical History:  Procedure Laterality Date   APPENDECTOMY  2008   TONSILLECTOMY     TUBAL LIGATION      Social History:  Social History   Socioeconomic History   Marital status: Widowed    Spouse name: Not on file   Number of children: Not on file   Years of education: Not on file   Highest education level: Not on file  Occupational History   Not on file  Social Needs   Financial resource strain: Not on file   Food insecurity    Worry: Not on file    Inability: Not on file   Transportation needs    Medical: Not on file    Non-medical: Not on file  Tobacco Use   Smoking status: Never Smoker   Smokeless tobacco: Never Used  Substance and Sexual Activity   Alcohol use: Never    Frequency: Never   Drug use: Never   Sexual activity: Not on file  Lifestyle   Physical activity    Days per week: Not on file    Minutes per session: Not on file   Stress: Not on file  Relationships   Social connections    Talks on phone: Not on file    Gets together: Not on file    Attends religious service: Not on file    Active member of club or organization: Not on file    Attends meetings of clubs or organizations: Not on file    Relationship status: Not on file   Intimate partner violence    Fear of current or ex partner: Not on file    Emotionally abused: Not on file    Physically abused: Not on file    Forced sexual activity: Not on file  Other Topics Concern   Not on file  Social History Narrative   She is going to a continuing care facility when she goes back to [place]. Her firstborn daughter lives up there. The patient is currently staying with her daughter Harriett Sine.   Right handed    Family History:  Family History  Family history unknown: Yes     Medications:   Current Outpatient Medications on File Prior to Visit  Medication Sig Dispense Refill   atorvastatin (LIPITOR) 40 MG tablet Take 2 tablets (80 mg total) by mouth daily at 6 PM. 30 tablet 0   clopidogrel (PLAVIX) 75 MG tablet Take 1 tablet (75 mg total) by mouth daily. Please take for the next three months. 30 tablet 0   losartan (COZAAR) 25 MG tablet Take 25 mg by mouth daily.     MAGNESIUM OXIDE PO Take 400 mg by mouth at bedtime.      Multiple Vitamin (MULTIVITAMIN WITH MINERALS) TABS tablet Take 1 tablet by mouth daily.     Omega-3  Fatty Acids (FISH OIL) 1000 MG CAPS Take 1,000 mg by mouth daily.     TURMERIC CURCUMIN PO Take 1 capsule by mouth daily.     No current facility-administered medications on file prior to visit.     Allergies:   Allergies  Allergen Reactions   Cephalexin Other (See Comments)    Reaction not recalled   Banana Itching, Rash and Other (See Comments)    Non-organic bannas   Kiwi Extract Itching and Rash   Other Itching and Rash    Mangos and poison ivy, also  Pesticide   Penicillins Rash    Did it involve swelling of the face/tongue/throat, SOB, or low BP? No, but extensive rash Did it involve sudden or severe rash/hives, skin peeling, or any reaction on the inside of your mouth or nose? No Did you need to seek medical attention at a hospital or doctor's office? No When did it last happen? Over 60 years ago If all above answers are NO, may proceed with cephalosporin use.    Sulfa Antibiotics Hives and Rash     Physical Exam  Vitals:   07/07/19 1414  BP: 124/66  Pulse: 68  Temp: (!) 97.4 F (36.3 C)  Weight: 120 lb (54.4 kg)  Height: 5\' 1"  (1.549 m)   Body mass index is 22.67 kg/m. No exam data present  Dep[facility] screen [facility] 07/07/2019  Decreased Interest 0  Down, Depressed, Hopeless 0  PHQ - 2 Score 0     General: well developed, well nourished,  very pleasant elderly Caucasian female, seated,  in no evident distress Head: head normocephalic and atraumatic.   Neck: supple with no carotid or supraclavicular bruits Cardiovascular: regular rate and rhythm, no murmurs Musculoskeletal: no deformity Skin:  no rash/petichiae Vascular:  Normal pulses all extremities   Neurologic Exam Mental Status: Awake and fully alert. Oriented to place and time. Recent and remote memory slightly diminished. Attention span, concentration and fund of knowledge appropriate throughout visit. Mood and affect appropriate.  Cranial Nerves: Fundoscopic exam reveals sharp disc margins. Pupils equal, briskly reactive to light. Extraocular movements full without nystagmus. Visual fields full to confrontation. Hearing intact. Facial sensation intact. Face, tongue, palate moves normally and symmetrically.  Motor: Normal bulk and tone. Normal strength in all tested extremity muscles. Sensory.: intact to touch , pinprick , position and vibratory sensation.  Coordination: Rapid alternating movements normal in all extremities. Finger-to-nose and heel-to-shin performed accurately bilaterally. Gait and Station: Arises from chair without difficulty. Stance is normal. Gait demonstrates normal stride length and balance without use of assistive device Reflexes: 1+ and symmetric. Toes downgoing.     NIHSS  0 Modified Rankin  0    Diagnostic Data (Labs, Imaging, Testing)   CT head No acute abnormality. Small vessel disease. Atrophy.     MRI  L paramedian pontine infarct. Moderate small vessel disease.   MRA  No ELVO. No significant BA stenosis. Intracranial atherosclerosis, including right P2 segmental high grade stenosis.   Carotid Doppler  B ICA 1-39% stenosis, VAs antegrade   2D Echo EF 60-65%  LDL 151  HgbA1c 5.6    ASSESSMENT: Pasty ArchSuzanne Srey is a 85 y.o. year old female presented with dizziness, ataxia and slurred speech on 05/21/2019 with stroke work-up showed left paramedian pontine infarct secondary to  small vessel disease source.  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  Also recommended 30-day cardiac event monitor to rule out A. fib.  Vascular risk factors include HTN, HLD, history of TIA and history of cognitive impairment.  She has recovered well from a stroke standpoint without residual deficits or reoccurring symptoms.    PLAN:  1. Left paramedian pontine stroke: Continue clopidogrel 75 mg daily and restart aspirin 81 mg daily and continue atorvastatin 80 mg daily for secondary stroke prevention.  Advised to continue DAPT for 2 months and then Plavix alone.  Maintain strict control of hypertension with blood pressure goal below 130/90, diabetes with hemoglobin A1c goal below 6.5% and cholesterol with LDL cholesterol (bad cholesterol) goal below 70 mg/dL.  I also advised the patient to eat a healthy diet with plenty of whole grains, cereals, fruits and vegetables, exercise regularly with at least 30 minutes of continuous activity daily and maintain ideal body weight. 2. HTN: Advised to continue current treatment regimen.  Today's BP 124/66.  Advised to continue to monitor at home along with continued follow-up with PCP for management 3. HLD: Advised to continue current treatment regimen along with continued follow-up with PCP for future prescribing and monitoring of lipid panel 4. Intracranial stenosis: Evidence of stenosis/occlusion on MRA on 05/22/2019 but CTA did not show evidence of stenosis/occlusion.  Recommend repeating MRA head at this time.  Also discussed importance of 38-month DAPT and increase statin dosage.  Recommend 30-day cardiac event monitor when she returns back to [place] in 1 month with her PCP to rule out atrial fibrillation   No follow-up indicated as she will return back to [place] in 1 month.  Advised patient and  daughter to contact office for follow-up if there is a delay in her return for any reason or to call office with any questions or concerns.  Also advised patient and daughter that this information will be sent to her PCP in [place] Dr. Edrick Oh located at [facility]   Greater than 50% of time during this 45 minute visit was spent on counseling, explanation of diagnosis of left pontine stroke and TIA, reviewing risk factor management of intracranial stenosis, HTN and HLD, planning of further management along with potential future management, and discussion with patient and family answering all questions.    Frann Rider, AGNP-BC  [facility] [address] [place], [place] [address]  Phone [phone] Fax [phone] Note: This document was prepared with digital dictation and possible smart phrase technology. Any transcriptional errors that result from this process are unintentional.

## 2019-07-07 NOTE — Patient Instructions (Addendum)
Repeat MRA head/neck due to previously found narrowing of your vessels  Recommend obtaining 30-day cardiac event monitor by your PCP once you return to [place].  If return to [place] is postponed, please let us know and I will be more than happy to place monitoring  Continue aspirin 81 mg daily and clopidogrel 75 mg daily  and atorvastatin 80 mg daily for secondary stroke prevention  Continue both aspirin Plavix for an additional 2 months and then continue Plavix alone  Continue to follow up with PCP regarding cholesterol and blood pressure management   also recommended to follow-up with your PCP in regards to evidence of thyroid nodule found in your recent imaging.  This was recommended to follow-up on a nonemergent basis therefore reasonable to follow-up with your PCP able to return home  Continue to monitor blood pressure at home  Maintain strict control of hypertension with blood pressure goal below 130/90, diabetes with hemoglobin A1c goal below 6.5% and cholesterol with LDL cholesterol (bad cholesterol) goal below 70 mg/dL. I also advised the patient to eat a healthy diet with plenty of whole grains, cereals, fruits and vegetables, exercise regularly and maintain ideal body weight.         Thank you for coming to see [place] at [facility]. I hope we have been able to provide you high quality care today.  You may receive a patient satisfaction survey over the next few weeks. We would appreciate your feedback and comments so that we may continue to improve ourselves and the health of our patients.

## 2019-07-08 ENCOUNTER — Ambulatory Visit: Payer: PRIVATE HEALTH INSURANCE | Admitting: Physical Therapy

## 2019-07-08 ENCOUNTER — Encounter: Payer: PRIVATE HEALTH INSURANCE | Admitting: Occupational Therapy

## 2019-07-18 ENCOUNTER — Emergency Department (HOSPITAL_COMMUNITY): Payer: Medicare Other

## 2019-07-18 ENCOUNTER — Emergency Department (HOSPITAL_COMMUNITY)
Admission: EM | Admit: 2019-07-18 | Discharge: 2019-07-18 | Disposition: A | Discharge status: Home / Self Care | Payer: Medicare Other | Attending: Emergency Medicine | Admitting: Emergency Medicine

## 2019-07-18 ENCOUNTER — Other Ambulatory Visit: Payer: Self-pay

## 2019-07-18 ENCOUNTER — Encounter (HOSPITAL_COMMUNITY): Payer: Self-pay | Admitting: Emergency Medicine

## 2019-07-18 DIAGNOSIS — Y999 Unspecified external cause status: Secondary | ICD-10-CM | POA: Insufficient documentation

## 2019-07-18 DIAGNOSIS — Z7982 Long term (current) use of aspirin: Secondary | ICD-10-CM | POA: Insufficient documentation

## 2019-07-18 DIAGNOSIS — W010XXA Fall on same level from slipping, tripping and stumbling without subsequent striking against object, initial encounter: Secondary | ICD-10-CM | POA: Insufficient documentation

## 2019-07-18 DIAGNOSIS — S62619A Displaced fracture of proximal phalanx of unspecified finger, initial encounter for closed fracture: Secondary | ICD-10-CM

## 2019-07-18 DIAGNOSIS — S62616A Displaced fracture of proximal phalanx of right little finger, initial encounter for closed fracture: Secondary | ICD-10-CM | POA: Diagnosis not present

## 2019-07-18 DIAGNOSIS — S0083XA Contusion of other part of head, initial encounter: Secondary | ICD-10-CM | POA: Diagnosis not present

## 2019-07-18 DIAGNOSIS — Z8673 Personal history of transient ischemic attack (TIA), and cerebral infarction without residual deficits: Secondary | ICD-10-CM | POA: Insufficient documentation

## 2019-07-18 DIAGNOSIS — I10 Essential (primary) hypertension: Secondary | ICD-10-CM | POA: Insufficient documentation

## 2019-07-18 DIAGNOSIS — Y939 Activity, unspecified: Secondary | ICD-10-CM | POA: Insufficient documentation

## 2019-07-18 DIAGNOSIS — S0990XA Unspecified injury of head, initial encounter: Secondary | ICD-10-CM | POA: Diagnosis present

## 2019-07-18 DIAGNOSIS — Z79899 Other long term (current) drug therapy: Secondary | ICD-10-CM | POA: Diagnosis not present

## 2019-07-18 DIAGNOSIS — Y929 Unspecified place or not applicable: Secondary | ICD-10-CM | POA: Insufficient documentation

## 2019-07-18 MED ORDER — HYDROCODONE-ACETAMINOPHEN 5-325 MG PO TABS
1.0000 | ORAL_TABLET | Freq: Once | ORAL | Status: DC
  Filled 2019-07-18: qty 1

## 2019-07-18 NOTE — ED Provider Notes (Signed)
[facility] EMERGENCY DEPARTMENT Provider Note   CSN: [id number] Arrival date & time: 07/18/19  1506     History   Chief Complaint Chief Complaint  Patient presents with   Fall   Head Injury    HPI Vicki Cummings is a 85 y.o. female.     85 yo F w/ PMH including CVA, HTN who p/w fell.  Just prior to arrival, the patient was walking and tripped and fell forward, striking her right hand and her right face.  She did not lose consciousness.  She has had bruising of her right hand with some pain as well as swelling of her right cheek.  She denies any head or neck pain, chest wall pain, or other extremity injury.  She has been ambulatory since the event.  No vision problems.  She is on Plavix, no other anticoagulation.  The history is provided by the patient.  Fall  Head Injury   Past Medical History:  Diagnosis Date   H/O blood clots    "30-40 years ago"   Hypertension    Mild cognitive impairment with memory loss    Radiculopathy    TIA (transient ischemic attack)     Patient Active Problem List   Diagnosis Date Noted   Acute CVA (cerebrovascular accident) (HCC) 05/22/2019   Essential hypertension 05/22/2019   Mild cognitive impairment with memory loss    Radiculopathy    Hypertension     Past Surgical History:  Procedure Laterality Date   APPENDECTOMY  2008   TONSILLECTOMY     TUBAL LIGATION       OB History   No obstetric history on file.      Home Medications    Prior to Admission medications   Medication Sig Start Date End Date Taking? Authorizing Provider  aspirin EC 81 MG tablet Take 81 mg by mouth daily.   Yes [provider]  atorvastatin (LIPITOR) 40 MG tablet Take 2 tablets (80 mg total) by mouth daily at 6 PM. 06/11/19  Yes Vaithi, Ramupriya, MD  clopidogrel (PLAVIX) 75 MG tablet Take 1 tablet (75 mg total) by mouth daily. Please take for the next three months. 06/11/19  Yes Chester Holstein, MD    losartan (COZAAR) 25 MG tablet Take 25 mg by mouth daily. 03/30/19  Yes [provider]  MAGNESIUM OXIDE PO Take 400 mg by mouth at bedtime.    Yes [provider]  Multiple Vitamin (MULTIVITAMIN WITH MINERALS) TABS tablet Take 1 tablet by mouth daily.   Yes [provider]  Omega-3 Fatty Acids (FISH OIL) 1000 MG CAPS Take 1,000 mg by mouth daily.   Yes [provider]  TURMERIC CURCUMIN PO Take 1 capsule by mouth daily.   Yes [provider]    Family History Family History  Family history unknown: Yes    Social History Social History   Tobacco Use   Smoking status: Never Smoker   Smokeless tobacco: Never Used  Substance Use Topics   Alcohol use: Never    Frequency: Never   Drug use: Never     Allergies   Cephalexin, Banana, Kiwi extract, Other, Penicillins, and Sulfa antibiotics   Review of Systems Review of Systems All other systems reviewed and are negative except that which was mentioned in HPI   Physical Exam Updated Vital Signs BP (!) 180/82 (BP Location: Left Arm)    Pulse 85    Temp 97.7 F (36.5 C) (Oral)  Resp 16    SpO2 99%   Physical Exam Vitals signs and nursing note reviewed.  Constitutional:      General: She is not in acute distress.    Appearance: She is well-developed.  HENT:     Head: Normocephalic.     Comments: Swelling of R cheek with faint bruising under R eye; no trismus, no dental trauma    Nose: Nose normal.  Eyes:     Extraocular Movements: Extraocular movements intact.     Conjunctiva/sclera: Conjunctivae normal.     Pupils: Pupils are equal, round, and reactive to light.  Neck:     Musculoskeletal: Neck supple. No muscular tenderness.  Cardiovascular:     Rate and Rhythm: Normal rate and regular rhythm.     Heart sounds: Normal heart sounds. No murmur.  Pulmonary:     Effort: Pulmonary effort is normal.     Breath sounds: Normal breath sounds.  Abdominal:     General: Bowel  sounds are normal. There is no distension.     Palpations: Abdomen is soft.     Tenderness: There is no abdominal tenderness.  Musculoskeletal:     Comments: Edema and ecchymosis R 5th finger and over dorsal R hand at 4th-5th MCP joints; normal sensation hand; no other extremity tenderness  Skin:    General: Skin is warm and dry.  Neurological:     Mental Status: She is alert and oriented to person, place, and time.     Motor: No weakness.     Coordination: Coordination normal.     Comments: Fluent speech  Psychiatric:        Judgment: Judgment normal.      ED Treatments / Results  Labs (all labs ordered are listed, but only abnormal results are displayed) Labs Reviewed - No data to display  EKG None  Radiology Ct Head Wo Contrast  Result Date: 07/18/2019 CLINICAL DATA:  Swelling to the right face after a mechanical fall. EXAM: CT HEAD WITHOUT CONTRAST CT MAXILLOFACIAL WITHOUT CONTRAST TECHNIQUE: Multidetector CT imaging of the head and maxillofacial structures were performed using the standard protocol without intravenous contrast. Multiplanar CT image reconstructions of the maxillofacial structures were also generated. COMPARISON:  CT head dated 05/21/2019. FINDINGS: CT HEAD FINDINGS Brain: No evidence of acute infarction, hemorrhage, hydrocephalus, extra-axial collection or mass lesion/mass effect. There is mild cerebral volume loss with associated ex vacuo dilatation. Periventricular white matter hypoattenuation likely represents chronic small vessel ischemic disease. Vascular: There are vascular calcifications in the carotid siphons. Skull: Normal. Negative for fracture or focal lesion. Other: None. CT MAXILLOFACIAL FINDINGS Osseous: No fracture or mandibular dislocation. No destructive process. Orbits: Negative. No traumatic or inflammatory finding. Sinuses: There is left sphenoid sinus disease. Soft tissues: There is soft tissue swelling overlying the right face and jaw.  IMPRESSION: 1. Soft tissue swelling overlying the right face and jaw. No acute facial fracture. 2. No acute intracranial process. Electronically Signed   By: Zerita Boers M.D.   On: 07/18/2019 17:06   Dg Hand Complete Right  Result Date: 07/18/2019 CLINICAL DATA:  85 year old female with fall and right hand injury. EXAM: RIGHT HAND - COMPLETE 3+ VIEW COMPARISON:  None. FINDINGS: There is a comminuted and angulated fracture of the proximal aspect of the proximal phalanx of the fifth digit with dorsal angulation of the distal fracture fragment. No intra-articular extension. The bones are osteopenic. There is no dislocation. There is osteoarthritic changes of the base of the thumb. Ovoid sclerotic area  involving the middle phalanx of the fourth digit without associated cortical breakage or periosteal elevation, or other aggressive features. Mild soft tissue swelling over the fifth MCP joint. IMPRESSION: Comminuted fracture of the proximal phalanx of the fifth digit with dorsal angulation of the distal fracture fragment. No intra-articular extension. No dislocation. Electronically Signed   By: Elgie CollardArash  Radparvar M.D.   On: 07/18/2019 16:42   Ct Maxillofacial Wo Cm  Result Date: 07/18/2019 CLINICAL DATA:  Swelling to the right face after a mechanical fall. EXAM: CT HEAD WITHOUT CONTRAST CT MAXILLOFACIAL WITHOUT CONTRAST TECHNIQUE: Multidetector CT imaging of the head and maxillofacial structures were performed using the standard protocol without intravenous contrast. Multiplanar CT image reconstructions of the maxillofacial structures were also generated. COMPARISON:  CT head dated 05/21/2019. FINDINGS: CT HEAD FINDINGS Brain: No evidence of acute infarction, hemorrhage, hydrocephalus, extra-axial collection or mass lesion/mass effect. There is mild cerebral volume loss with associated ex vacuo dilatation. Periventricular white matter hypoattenuation likely represents chronic small vessel ischemic disease.  Vascular: There are vascular calcifications in the carotid siphons. Skull: Normal. Negative for fracture or focal lesion. Other: None. CT MAXILLOFACIAL FINDINGS Osseous: No fracture or mandibular dislocation. No destructive process. Orbits: Negative. No traumatic or inflammatory finding. Sinuses: There is left sphenoid sinus disease. Soft tissues: There is soft tissue swelling overlying the right face and jaw. IMPRESSION: 1. Soft tissue swelling overlying the right face and jaw. No acute facial fracture. 2. No acute intracranial process. Electronically Signed   By: Romona Curlsyler  Litton M.D.   On: 07/18/2019 17:06    Procedures Procedures (including critical care time)  Medications Ordered in ED Medications - No data to display   Initial Impression / Assessment and Plan / ED Course  I have reviewed the triage vital signs and the nursing notes.  Pertinent imaging results that were available during my care of the patient were reviewed by me and considered in my medical decision making (see chart for details).        Patient was alert, GCS 15, reassuring neurologic exam.  Right facial swelling and right hand injury as above.  CT head and face negative acute. Hand XR shows comminuted right fifth finger proximal phalanx fracture with angulation. Discussed w/ hand, Dr. Amanda PeaGramig, who recommended ulnar gutter splint and f/u in his clinic Monday.  Placed patient in splinting and discussed follow-up plan.  Emphasized the importance of elevation and reviewed return precautions with the patient and her daughter regarding hand injury as well as her head injury.  They voiced understanding.  Final Clinical Impressions(s) / ED Diagnoses   Final diagnoses:  Closed displaced fracture of proximal phalanx of finger of right hand  Contusion of cheek, initial encounter    ED Discharge Orders    None       Dare Sanger, Ambrose Finlandachel Morgan, MD 07/19/19 0023

## 2019-07-18 NOTE — ED Triage Notes (Addendum)
Pt here for eval of swelling to right face and right post mechanical fall. Pt states she did not pass out and fall. Deformity noted to right pinky finger. Ice applied prior to arrival. Pt is on Plavix

## 2019-07-18 NOTE — Progress Notes (Signed)
Orthopedic Tech Progress Note Patient Details:  Vicki Cummings [birth year] [id number]  Ortho Devices Type of Ortho Device: Arm sling, Ulna gutter splint Ortho Device/Splint Location: no bend in splint due to fracture being in finger. Ortho Device/Splint Interventions: Ordered, Application, Adjustment   Post Interventions Patient Tolerated: Well Instructions Provided: Care of device, Adjustment of device   Karolee Stamps 07/18/2019, 8:02 PM

## 2019-07-18 NOTE — ED Notes (Signed)
Patient verbalizes understanding of discharge instructions. Opportunity for questioning and answers were provided. pt discharged from ED with daughter.   

## 2019-07-24 ENCOUNTER — Ambulatory Visit: Payer: Medicare Other | Admitting: Physical Therapy

## 2019-07-28 ENCOUNTER — Telehealth: Payer: Self-pay | Admitting: Adult Health

## 2019-07-28 NOTE — Telephone Encounter (Signed)
Spoke to pt and her daughter, nancy.  Pt had fall in 07-18-19 went to ED.  See notes [facility].  Has bruising,bleed on chin, has spread to a 2 inch oblong area.  (no pooling).  Question they are asking is plavix and aspirin too much for her (frame petite).  Stop aspirin and take plavix only?

## 2019-07-28 NOTE — Telephone Encounter (Signed)
Pt daughter(on DPR-[place]zan,Nancy) has called stating pt has some discoloring of her skin (bruising) and she feels this is as a result of  The aspirin tablet and plavix.  Please call

## 2019-07-28 NOTE — Telephone Encounter (Signed)
I spoke to pt and daughter.  I explained that per JM/NP she did not have any bleed from CT.  It will take some time for bruising to resolve.  Important to continue taking DAPT for the next 6 wks then to stop aspirin and continue taking plavix.  Monitor.  If things get worse let us know. (take picture now so have what is like to know if big change).  Both stated understanding.

## 2019-07-28 NOTE — Telephone Encounter (Signed)
Discussed with patient and daughter at prior visit regarding use of both aspirin and Plavix due to recent stroke with intracranial stenosis.  I am assuming her bruising is due to recent fall?  If so, this may take time to heal as her blood is thinner but her CT head did not show any bleeds.  It would be recommended for her to continue for an additional 6 weeks as discussed during visit for a total of 3 months DAPT to prevent reoccurring strokes

## 2019-08-05 ENCOUNTER — Ambulatory Visit
Admission: RE | Admit: 2019-08-05 | Discharge: 2019-08-05 | Disposition: A | Discharge status: Home / Self Care | Payer: Medicare Other | Source: Ambulatory Visit | Attending: Adult Health | Admitting: Adult Health

## 2019-08-05 ENCOUNTER — Other Ambulatory Visit: Payer: Self-pay

## 2019-08-05 ENCOUNTER — Telehealth: Payer: Self-pay | Admitting: Adult Health

## 2019-08-05 DIAGNOSIS — I6523 Occlusion and stenosis of bilateral carotid arteries: Secondary | ICD-10-CM | POA: Diagnosis not present

## 2019-08-05 DIAGNOSIS — I6350 Cerebral infarction due to unspecified occlusion or stenosis of unspecified cerebral artery: Secondary | ICD-10-CM

## 2019-08-05 DIAGNOSIS — I639 Cerebral infarction, unspecified: Secondary | ICD-10-CM

## 2019-08-05 NOTE — Telephone Encounter (Signed)
Mickel Baas @[facility] [phone] is asking for a call from either Janett Billow NP or [place] RN re: pt's MRI

## 2019-08-05 NOTE — Telephone Encounter (Signed)
Spoke to GI, at the # left.  Calling about order for pt who has appt today at 1400, athar(in JM/NP notes states getting MRA head, then the order placed is MRA neck.  Please advise, she is out of office today.

## 2019-08-05 NOTE — Telephone Encounter (Signed)
I called Rhonda at the GI.  The MRA head ordered.  The MRA neck was not the correct test. They will take care of it.

## 2019-08-05 NOTE — Addendum Note (Signed)
Addended by: Star Age on: 08/05/2019 12:29 PM   Modules accepted: Orders

## 2019-08-05 NOTE — Telephone Encounter (Signed)
MRA head w/o contrast ordered.

## 2019-08-11 ENCOUNTER — Telehealth: Payer: Self-pay | Admitting: *Deleted

## 2019-08-11 NOTE — Telephone Encounter (Signed)
I called pt and relayed that  Per JM/NP her recent MRA showed improvement from prior imaging, only shows mild narrowing but no evidence of occlusion.  She verbalized understanding.

## 2019-08-11 NOTE — Telephone Encounter (Signed)
-----   Message from Frann Rider, NP sent at 08/10/2019  3:53 PM EST ----- Please advise patient that her recent imaging showed improvement from prior imaging and only shows mild narrowing but no evidence of occlusion.

## 2019-10-02 ENCOUNTER — Encounter: Payer: Self-pay | Admitting: Physical Therapy

## 2019-10-02 NOTE — Therapy (Addendum)
[facility] [address] [place], [place], [address] Phone: [phone]   Fax:  [phone]  Patient Details  Name: Vicki Cummings MRN: [id number] Date of Birth: 11/21/1925 Referring Provider:  No ref. provider found  Encounter Date: 10/02/2019   Pt missed last visit (to be discharge visit) because provider had to be out. Patient has moved back to her home in [place].   PHYSICAL THERAPY DISCHARGE SUMMARY  Visits from Start of Care:6  Current functional level related to goals / functional outcomes: See most recent note with LTGs.    Remaining deficits: Impaired dynamic balance, vestibular input for balance.    Education / Equipment: HEP   Plan: Patient agrees to discharge.  Patient goals were partially met. Patient is being discharged due to meeting the stated rehab goals.  ?????      Arliss Journey, PT, DPT  10/02/2019, 9:31 AM  [facility] [address] [place], [place], [address] Phone: [phone]   Fax:  [phone]

## 2021-09-22 IMAGING — MR MR MRA HEAD W/O CM
1 series · 20 of 48 positions shown · non-contrast
Comparison: Brain MRI from earlier today

CLINICAL DATA: Stroke follow-up

EXAM:
MRA HEAD WITHOUT CONTRAST
TECHNIQUE: Angiographic images of the Circle of [place] were obtained using MRA
technique without intravenous contrast.

[Series 5: 3d cow · axial · 0.5mm · 0.41mm/px · z∈[-98,-18]mm · 20 of 172 slices shown]
[im 1/172]
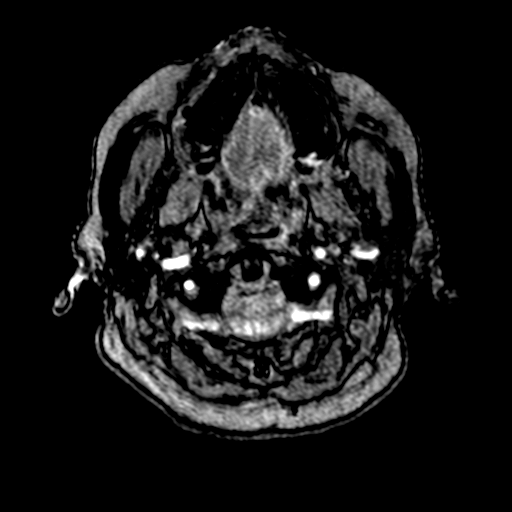
[im 4/172]
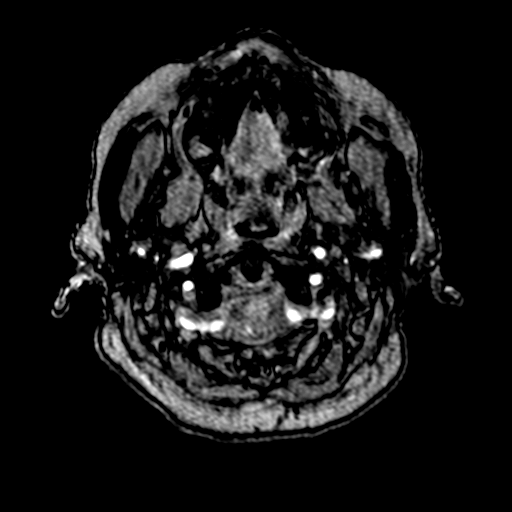
[im 8/172]
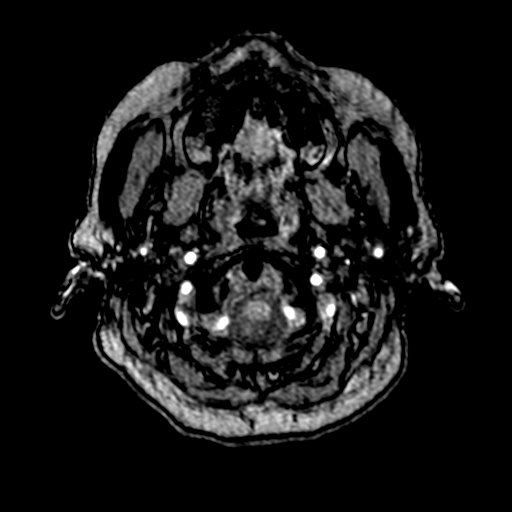
[im 11/172]
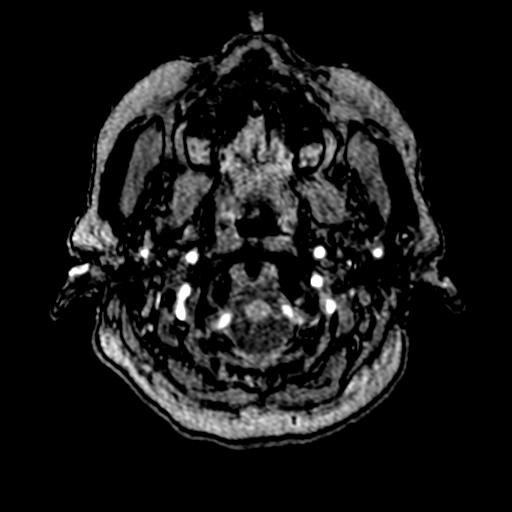
[im 15/172]
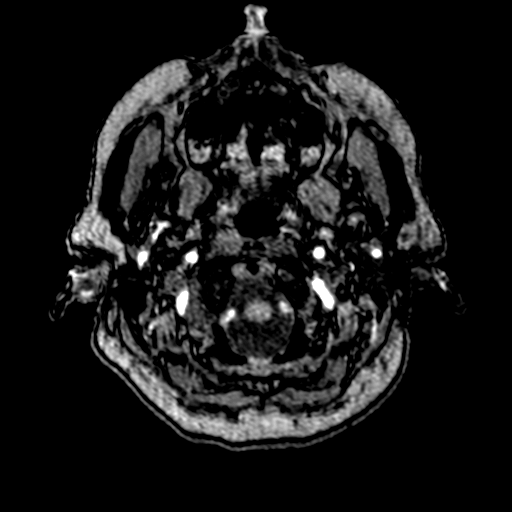
[im 19/172]
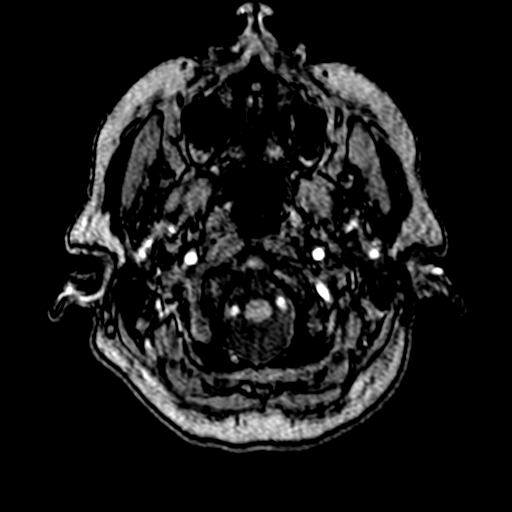
[im 22/172]
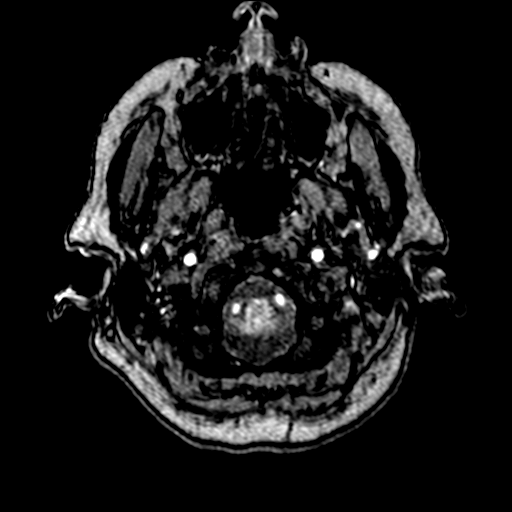
[im 26/172]
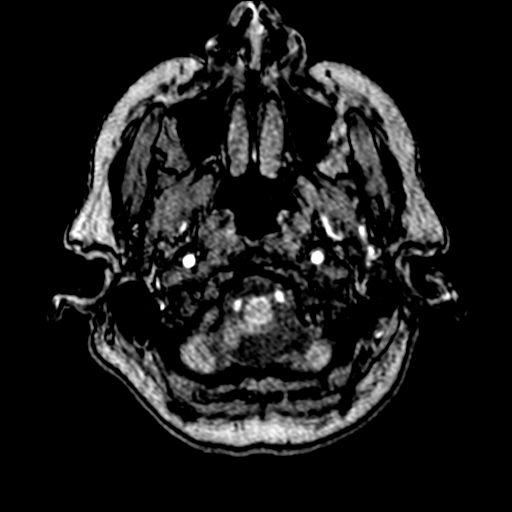
[im 30/172]
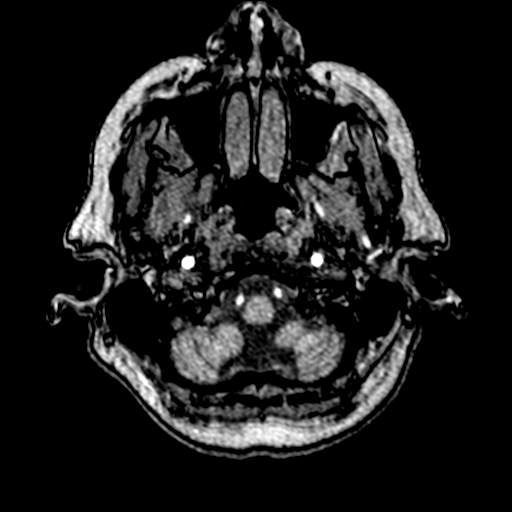
[im 33/172]
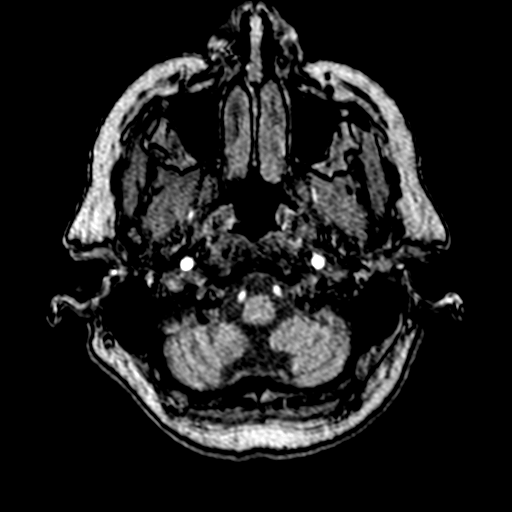
[im 37/172]
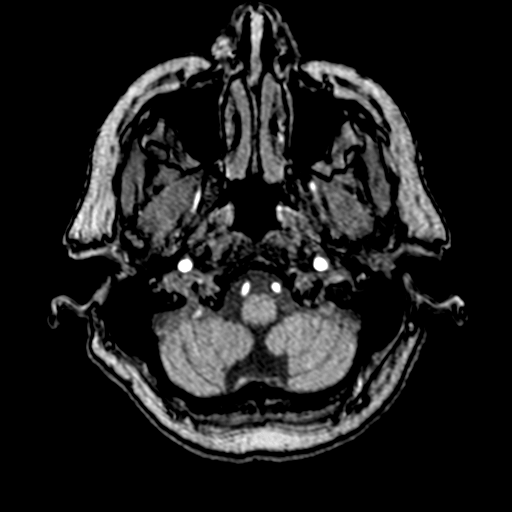
[im 41/172]
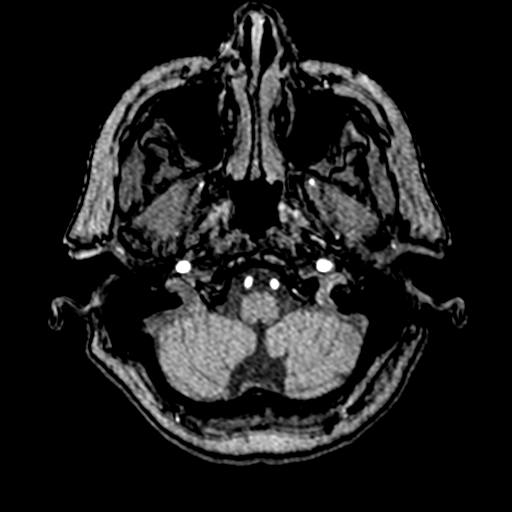
[im 55/172]
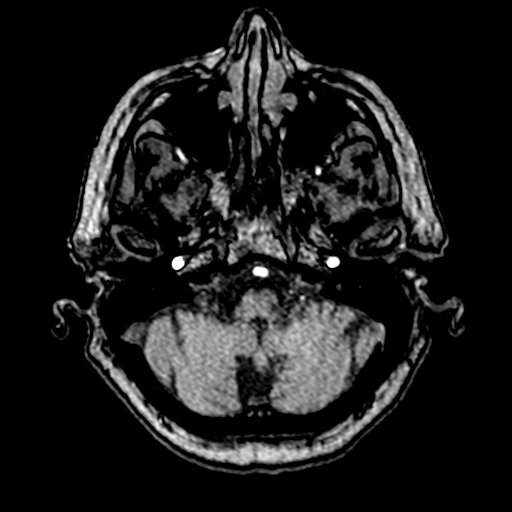
[im 77/172]
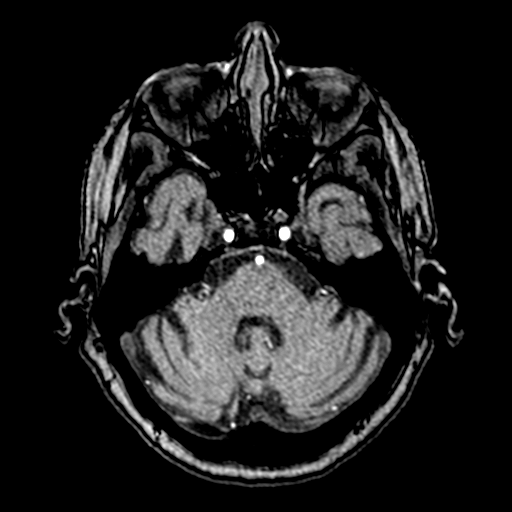
[im 88/172]
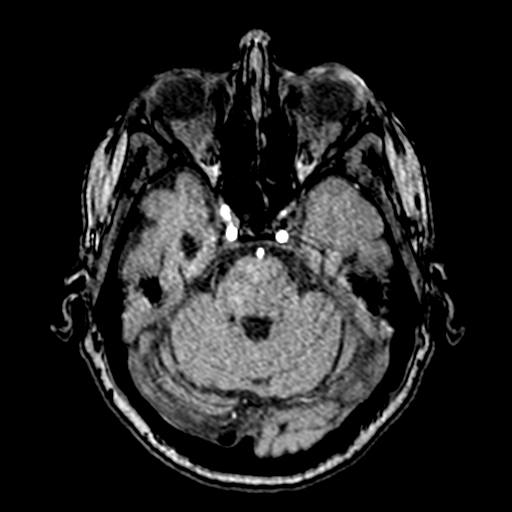
[im 99/172]
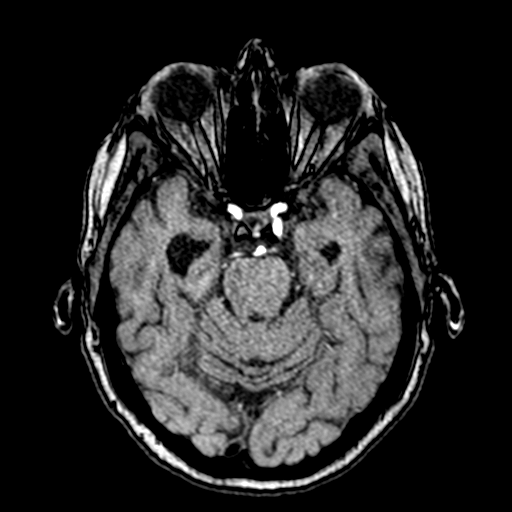
[im 121/172]
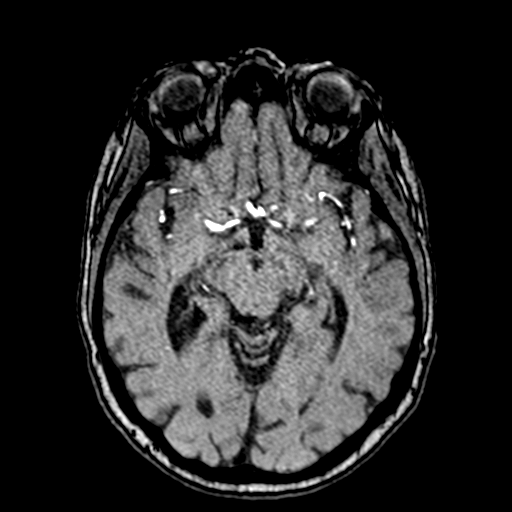
[im 142/172]
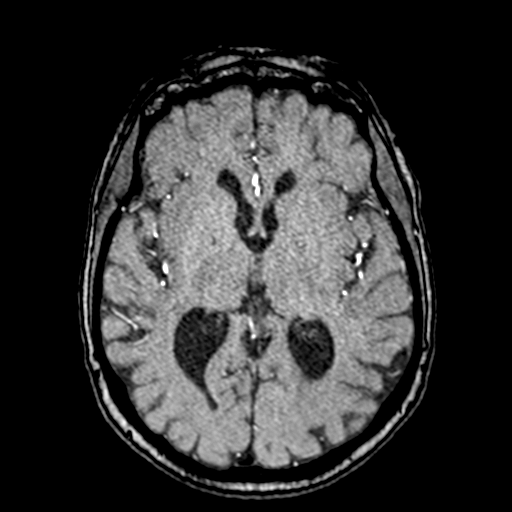
[im 146/172]
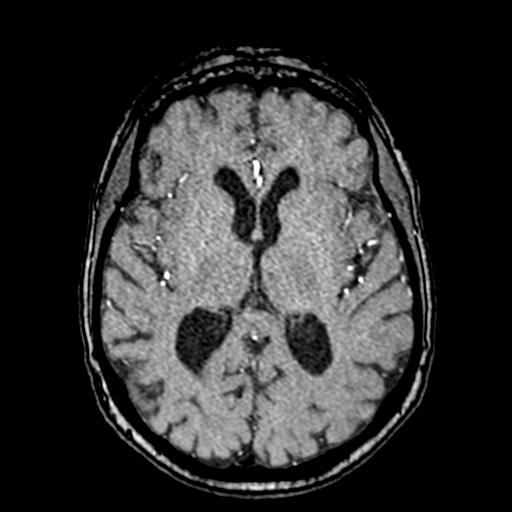
[im 164/172]
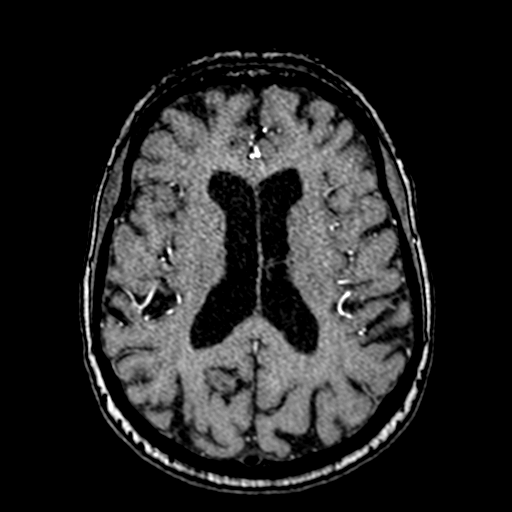

[20 of 48 positions shown; findings below may reference images not displayed]

FINDINGS: The vertebral and basilar arteries are diffusely patent. Mild
atheromatous type narrowing and irregularity of the mid basilar. No
branch occlusion, beading, or aneurysm. High-grade right P2 segment
narrowing. Moderate left inferior division M2 narrowing. High-grade
left superior PCA branch narrowing.
IMPRESSION: 1. No emergent finding.  No significant basilar stenosis.
2. Intracranial atherosclerosis, most notably high-grade right P2
segment narrowing.
